# Patient Record
Sex: Female | Born: 1935 | Race: White | Hispanic: No | Smoking: Never smoker
Health system: Southern US, Community
[De-identification: ages and names within clinical notes are randomized; demographics above are authoritative.]

## PROBLEM LIST (undated history)

## (undated) DIAGNOSIS — F329 Major depressive disorder, single episode, unspecified: Secondary | ICD-10-CM

## (undated) DIAGNOSIS — T7840XA Allergy, unspecified, initial encounter: Secondary | ICD-10-CM

## (undated) DIAGNOSIS — F419 Anxiety disorder, unspecified: Secondary | ICD-10-CM

## (undated) DIAGNOSIS — I10 Essential (primary) hypertension: Secondary | ICD-10-CM

## (undated) DIAGNOSIS — K589 Irritable bowel syndrome without diarrhea: Secondary | ICD-10-CM

## (undated) DIAGNOSIS — Z7189 Other specified counseling: Secondary | ICD-10-CM

## (undated) DIAGNOSIS — F0390 Unspecified dementia without behavioral disturbance: Secondary | ICD-10-CM

## (undated) DIAGNOSIS — F32A Depression, unspecified: Secondary | ICD-10-CM

## (undated) DIAGNOSIS — M1990 Unspecified osteoarthritis, unspecified site: Secondary | ICD-10-CM

## (undated) DIAGNOSIS — R0989 Other specified symptoms and signs involving the circulatory and respiratory systems: Secondary | ICD-10-CM

## (undated) DIAGNOSIS — B009 Herpesviral infection, unspecified: Secondary | ICD-10-CM

## (undated) HISTORY — DX: Major depressive disorder, single episode, unspecified: F32.9

## (undated) HISTORY — DX: Depression, unspecified: F32.A

## (undated) HISTORY — DX: Anxiety disorder, unspecified: F41.9

## (undated) HISTORY — PX: ROTATOR CUFF REPAIR: SHX139

## (undated) HISTORY — PX: ABDOMINAL HYSTERECTOMY: SHX81

## (undated) HISTORY — DX: Herpesviral infection, unspecified: B00.9

## (undated) HISTORY — DX: Essential (primary) hypertension: I10

## (undated) HISTORY — DX: Other specified symptoms and signs involving the circulatory and respiratory systems: R09.89

## (undated) HISTORY — DX: Irritable bowel syndrome, unspecified: K58.9

## (undated) HISTORY — PX: EYE SURGERY: SHX253

## (undated) HISTORY — DX: Unspecified osteoarthritis, unspecified site: M19.90

## (undated) HISTORY — PX: JOINT REPLACEMENT: SHX530

## (undated) HISTORY — DX: Allergy, unspecified, initial encounter: T78.40XA

## (undated) HISTORY — DX: Other specified counseling: Z71.89

---

## 2000-08-16 ENCOUNTER — Encounter: Admission: RE | Admit: 2000-08-16 | Discharge: 2000-08-16 | Payer: Self-pay | Admitting: Internal Medicine

## 2000-08-16 ENCOUNTER — Encounter: Payer: Self-pay | Admitting: Internal Medicine

## 2001-09-12 ENCOUNTER — Other Ambulatory Visit: Admission: RE | Admit: 2001-09-12 | Discharge: 2001-09-12 | Payer: Self-pay | Admitting: Internal Medicine

## 2002-12-18 ENCOUNTER — Ambulatory Visit (HOSPITAL_COMMUNITY): Admission: RE | Admit: 2002-12-18 | Discharge: 2002-12-18 | Payer: Self-pay | Admitting: Internal Medicine

## 2003-09-03 ENCOUNTER — Encounter: Admission: RE | Admit: 2003-09-03 | Discharge: 2003-09-03 | Payer: Self-pay | Admitting: Internal Medicine

## 2004-04-05 ENCOUNTER — Emergency Department (HOSPITAL_COMMUNITY): Admission: EM | Admit: 2004-04-05 | Discharge: 2004-04-05 | Payer: Self-pay | Admitting: Emergency Medicine

## 2005-11-30 ENCOUNTER — Other Ambulatory Visit: Admission: RE | Admit: 2005-11-30 | Discharge: 2005-11-30 | Payer: Self-pay | Admitting: Internal Medicine

## 2008-07-23 ENCOUNTER — Ambulatory Visit: Payer: Self-pay | Admitting: Internal Medicine

## 2009-02-04 ENCOUNTER — Ambulatory Visit: Payer: Self-pay | Admitting: Internal Medicine

## 2009-08-05 ENCOUNTER — Ambulatory Visit: Payer: Self-pay | Admitting: Internal Medicine

## 2009-11-09 ENCOUNTER — Ambulatory Visit: Payer: Self-pay | Admitting: Internal Medicine

## 2009-11-23 ENCOUNTER — Ambulatory Visit: Payer: Self-pay | Admitting: Internal Medicine

## 2009-12-27 ENCOUNTER — Inpatient Hospital Stay (HOSPITAL_COMMUNITY): Admission: RE | Admit: 2009-12-27 | Discharge: 2009-12-31 | Payer: Self-pay | Admitting: Orthopedic Surgery

## 2010-04-28 ENCOUNTER — Ambulatory Visit: Payer: Self-pay | Admitting: Internal Medicine

## 2010-05-27 ENCOUNTER — Ambulatory Visit
Admission: RE | Admit: 2010-05-27 | Discharge: 2010-05-27 | Payer: Self-pay | Source: Home / Self Care | Attending: Internal Medicine | Admitting: Internal Medicine

## 2010-06-01 ENCOUNTER — Encounter
Admission: RE | Admit: 2010-06-01 | Discharge: 2010-06-01 | Payer: Self-pay | Source: Home / Self Care | Attending: Internal Medicine | Admitting: Internal Medicine

## 2010-07-29 LAB — COMPREHENSIVE METABOLIC PANEL
ALT: 14 U/L (ref 0–35)
Alkaline Phosphatase: 57 U/L (ref 39–117)
BUN: 17 mg/dL (ref 6–23)
CO2: 28 mEq/L (ref 19–32)
GFR calc non Af Amer: >60 mL/min (ref >60)
Glucose, Bld: 92 mg/dL (ref 70–99)
Potassium: 4.8 mEq/L (ref 3.5–5.1)
Sodium: 139 mEq/L (ref 135–145)
Total Bilirubin: 0.6 mg/dL (ref 0.3–1.2)
Total Protein: 6.9 g/dL (ref 6.0–8.3)

## 2010-07-29 LAB — URINALYSIS, ROUTINE W REFLEX MICROSCOPIC
Glucose, UA: NEGATIVE mg/dL
Hgb urine dipstick: NEGATIVE
Ketones, ur: NEGATIVE mg/dL
Protein, ur: NEGATIVE mg/dL
pH: 6 (ref 5.0–8.0)

## 2010-07-29 LAB — CBC
HCT: 28.1 % — ABNORMAL LOW (ref 36.0–46.0)
HCT: 29.2 % — ABNORMAL LOW (ref 36.0–46.0)
HCT: 35.8 % — ABNORMAL LOW (ref 36.0–46.0)
Hemoglobin: 12 g/dL (ref 12.0–15.0)
MCH: 30.5 pg (ref 26.0–34.0)
MCH: 30.5 pg (ref 26.0–34.0)
MCH: 30.5 pg (ref 26.0–34.0)
MCHC: 33.4 g/dL (ref 30.0–36.0)
MCHC: 33.5 g/dL (ref 30.0–36.0)
MCHC: 33.8 g/dL (ref 30.0–36.0)
MCHC: 33.6 g/dL (ref 30.0–36.0)
MCV: 90 fL (ref 78.0–100.0)
MCV: 90.3 fL (ref 78.0–100.0)
MCV: 90.9 fL (ref 78.0–100.0)
MCV: 91.6 fL (ref 78.0–100.0)
Platelets: 178 10*3/uL (ref 150–400)
Platelets: 195 10*3/uL (ref 150–400)
Platelets: 248 10*3/uL (ref 150–400)
RDW: 12.5 % (ref 11.5–15.5)
RDW: 12.5 % (ref 11.5–15.5)
RDW: 12.7 % (ref 11.5–15.5)
RDW: 12.8 % (ref 11.5–15.5)
RDW: 12.5 % (ref 11.5–15.5)
WBC: 11 10*3/uL — ABNORMAL HIGH (ref 4.0–10.5)
WBC: 9.1 10*3/uL (ref 4.0–10.5)

## 2010-07-29 LAB — PROTIME-INR
INR: 1.12 (ref 0.00–1.49)
INR: 1.01 (ref 0.00–1.49)
Prothrombin Time: 13.5 seconds (ref 11.6–15.2)

## 2010-07-29 LAB — SURGICAL PCR SCREEN: MRSA, PCR: NEGATIVE

## 2010-07-29 LAB — BASIC METABOLIC PANEL
BUN: 10 mg/dL (ref 6–23)
BUN: 7 mg/dL (ref 6–23)
CO2: 24 mEq/L (ref 19–32)
Calcium: 7.9 mg/dL — ABNORMAL LOW (ref 8.4–10.5)
Chloride: 110 mEq/L (ref 96–112)
Creatinine, Ser: 0.72 mg/dL (ref 0.4–1.2)
GFR calc non Af Amer: >60 mL/min (ref >60)
Glucose, Bld: 141 mg/dL — ABNORMAL HIGH (ref 70–99)
Glucose, Bld: 147 mg/dL — ABNORMAL HIGH (ref 70–99)
Potassium: 3.7 mEq/L (ref 3.5–5.1)

## 2010-07-29 LAB — TYPE AND SCREEN
ABO/RH(D): O POS
Antibody Screen: NEGATIVE

## 2010-08-20 ENCOUNTER — Emergency Department (HOSPITAL_COMMUNITY): Payer: Medicare Other

## 2010-08-20 ENCOUNTER — Emergency Department (HOSPITAL_COMMUNITY)
Admission: EM | Admit: 2010-08-20 | Discharge: 2010-08-20 | Disposition: A | Discharge status: Home / Self Care | Payer: Medicare Other | Attending: Emergency Medicine | Admitting: Emergency Medicine

## 2010-08-20 DIAGNOSIS — S62109A Fracture of unspecified carpal bone, unspecified wrist, initial encounter for closed fracture: Secondary | ICD-10-CM | POA: Insufficient documentation

## 2010-08-20 DIAGNOSIS — M542 Cervicalgia: Secondary | ICD-10-CM | POA: Insufficient documentation

## 2010-08-20 DIAGNOSIS — R51 Headache: Secondary | ICD-10-CM | POA: Insufficient documentation

## 2010-08-20 DIAGNOSIS — D649 Anemia, unspecified: Secondary | ICD-10-CM | POA: Insufficient documentation

## 2010-08-20 DIAGNOSIS — S0180XA Unspecified open wound of other part of head, initial encounter: Secondary | ICD-10-CM | POA: Insufficient documentation

## 2010-08-20 DIAGNOSIS — I10 Essential (primary) hypertension: Secondary | ICD-10-CM | POA: Insufficient documentation

## 2010-08-20 DIAGNOSIS — Y92009 Unspecified place in unspecified non-institutional (private) residence as the place of occurrence of the external cause: Secondary | ICD-10-CM | POA: Insufficient documentation

## 2010-08-20 DIAGNOSIS — S02109A Fracture of base of skull, unspecified side, initial encounter for closed fracture: Secondary | ICD-10-CM | POA: Insufficient documentation

## 2010-08-20 DIAGNOSIS — W101XXA Fall (on)(from) sidewalk curb, initial encounter: Secondary | ICD-10-CM | POA: Insufficient documentation

## 2010-11-29 ENCOUNTER — Other Ambulatory Visit: Payer: Self-pay | Admitting: Internal Medicine

## 2010-11-30 ENCOUNTER — Other Ambulatory Visit: Payer: Self-pay

## 2010-11-30 MED ORDER — TEMAZEPAM 15 MG PO CAPS: 15.0000 mg | ORAL_CAPSULE | Freq: Every evening | ORAL | Status: DC | PRN

## 2010-11-30 MED ORDER — DONEPEZIL HCL 5 MG PO TABS: 5.0000 mg | ORAL_TABLET | Freq: Every day | ORAL | Status: DC

## 2010-12-29 ENCOUNTER — Encounter: Payer: Self-pay | Admitting: Internal Medicine

## 2010-12-29 ENCOUNTER — Ambulatory Visit (INDEPENDENT_AMBULATORY_CARE_PROVIDER_SITE_OTHER): Payer: Medicare Other | Admitting: Internal Medicine

## 2010-12-29 VITALS — BP 130/78 | HR 78 | Temp 99.0°F | Ht <= 58 in | Wt 118.5 lb

## 2010-12-29 DIAGNOSIS — R05 Cough: Secondary | ICD-10-CM

## 2010-12-29 DIAGNOSIS — J40 Bronchitis, not specified as acute or chronic: Secondary | ICD-10-CM

## 2010-12-29 MED ORDER — CEFTRIAXONE SODIUM 1 G IJ SOLR
1.0000 g | Freq: Once | INTRAMUSCULAR | Status: AC
  Administered 2010-12-29: 1 g via INTRAMUSCULAR

## 2010-12-30 ENCOUNTER — Ambulatory Visit (INDEPENDENT_AMBULATORY_CARE_PROVIDER_SITE_OTHER): Payer: Medicare Other | Admitting: Internal Medicine

## 2010-12-30 ENCOUNTER — Encounter: Payer: Self-pay | Admitting: Internal Medicine

## 2010-12-30 ENCOUNTER — Ambulatory Visit
Admission: RE | Admit: 2010-12-30 | Discharge: 2010-12-30 | Disposition: A | Discharge status: Home / Self Care | Payer: BLUE CROSS/BLUE SHIELD | Source: Ambulatory Visit | Attending: Internal Medicine | Admitting: Internal Medicine

## 2010-12-30 VITALS — Temp 99.2°F

## 2010-12-30 DIAGNOSIS — J189 Pneumonia, unspecified organism: Secondary | ICD-10-CM

## 2010-12-30 DIAGNOSIS — I10 Essential (primary) hypertension: Secondary | ICD-10-CM

## 2010-12-30 DIAGNOSIS — R05 Cough: Secondary | ICD-10-CM

## 2010-12-30 DIAGNOSIS — F0390 Unspecified dementia without behavioral disturbance: Secondary | ICD-10-CM | POA: Insufficient documentation

## 2010-12-30 MED ORDER — CEFTRIAXONE SODIUM 1 G IJ SOLR
1.0000 g | INTRAMUSCULAR | Status: AC
  Administered 2010-12-30: 1 g via INTRAMUSCULAR

## 2010-12-30 NOTE — Progress Notes (Signed)
  Subjective:    Patient ID: Laura Lowe, female    DOB: 1919/09/20, 77 y.o.   MRN: [id number]  HPI    Review of Systems     Objective:   Physical Exam        Assessment & Plan:

## 2010-12-30 NOTE — Progress Notes (Signed)
  Subjective:    Patient ID: Laura Lowe, female    DOB: November 01, 1919, 77 y.o.   MRN: [id number]  HPI 77 year old white female with history of dementia went to the beach with her family and call respiratory infection from a relative. Has been coughing quite a bit. Only thick white sputum production. No documented fever or shaking chills. Daughter who is a Engineer, civil (consulting) is been concerned.    Review of Systems     Objective:   Physical Exam chest: Wheezing and rales right upper and right lower lobe.        Assessment & Plan:  Chest x-ray shows right lower lobe pneumonia. Given 1 g IM Rocephin. She got 1 g IM Rocephin yesterday. She is on a Zithromax Z-Pak. Recheck in 10 days with chest x-ray and office visit

## 2010-12-30 NOTE — Progress Notes (Signed)
  Subjective:    Patient ID: Laura Lowe, female    DOB: 01/18/1920, 77 y.o.   MRN: [id number]  HPI in an 77 year old white female with history of dementia and hypertension. She went to the beach with some relatives and came down with a respiratory infection. Has had a lot of coughing. Says sputum is white and thick. Denies discolored sputum, fever, or shaking chills. Cough sounds loose and congested.    Review of Systems     Objective:   Physical Exam HEENT exam: TMs and pharynx are clear; neck is supple; chest: rales and wheezing right upper lobe         Assessment & Plan:  Bronchitis- possible pneumonia  Plan: Rocephin 1 g IM given in the office today  Zithromax Z-Pak take as directed  Tessalon Perles 100 mg (#60) 2 by mouth 3 times a day when necessary for cough  Chest x-ray tomorrow (PA and lateral)

## 2011-01-01 MED ORDER — CEFTRIAXONE SODIUM 1 G IJ SOLR: 1.0000 g | Freq: Once | INTRAMUSCULAR | Status: DC

## 2011-01-10 ENCOUNTER — Ambulatory Visit
Admission: RE | Admit: 2011-01-10 | Discharge: 2011-01-10 | Disposition: A | Discharge status: Home / Self Care | Payer: Medicare Other | Source: Ambulatory Visit | Attending: Internal Medicine | Admitting: Internal Medicine

## 2011-01-10 ENCOUNTER — Other Ambulatory Visit: Payer: Self-pay | Admitting: Internal Medicine

## 2011-01-10 DIAGNOSIS — J189 Pneumonia, unspecified organism: Secondary | ICD-10-CM

## 2011-02-02 ENCOUNTER — Other Ambulatory Visit: Payer: Medicare Other | Admitting: Internal Medicine

## 2011-02-03 ENCOUNTER — Encounter: Payer: Medicare Other | Admitting: Internal Medicine

## 2011-03-13 ENCOUNTER — Other Ambulatory Visit: Payer: Medicare Other | Admitting: Internal Medicine

## 2011-03-13 DIAGNOSIS — I10 Essential (primary) hypertension: Secondary | ICD-10-CM

## 2011-03-13 DIAGNOSIS — E785 Hyperlipidemia, unspecified: Secondary | ICD-10-CM

## 2011-03-13 DIAGNOSIS — D649 Anemia, unspecified: Secondary | ICD-10-CM

## 2011-03-13 DIAGNOSIS — E559 Vitamin D deficiency, unspecified: Secondary | ICD-10-CM

## 2011-03-13 LAB — CBC WITH DIFFERENTIAL/PLATELET
Basophils Absolute: 0 10*3/uL (ref 0.0–0.1)
Eosinophils Absolute: 0.2 10*3/uL (ref 0.0–0.7)
Eosinophils Relative: 3 % (ref 0–5)
Lymphocytes Relative: 30 % (ref 12–46)
MCH: 29.2 pg (ref 26.0–34.0)
MCV: 93.7 fL (ref 78.0–100.0)
Platelets: 269 10*3/uL (ref 150–400)
RDW: 13.6 % (ref 11.5–15.5)
WBC: 7.1 10*3/uL (ref 4.0–10.5)

## 2011-03-13 LAB — COMPREHENSIVE METABOLIC PANEL
ALT: 13 U/L (ref 0–35)
AST: 20 U/L (ref 0–37)
Alkaline Phosphatase: 60 U/L (ref 39–117)
Creat: 0.8 mg/dL (ref 0.50–1.10)
Total Bilirubin: 0.5 mg/dL (ref 0.3–1.2)

## 2011-03-13 LAB — LIPID PANEL
Total CHOL/HDL Ratio: 3.3 Ratio
VLDL: 19 mg/dL (ref 0–40)

## 2011-03-13 LAB — TSH: TSH: 6.206 u[IU]/mL — ABNORMAL HIGH (ref 0.350–4.500)

## 2011-03-14 ENCOUNTER — Other Ambulatory Visit: Payer: Medicare Other | Admitting: Internal Medicine

## 2011-03-14 ENCOUNTER — Ambulatory Visit (INDEPENDENT_AMBULATORY_CARE_PROVIDER_SITE_OTHER): Payer: Medicare Other | Admitting: Internal Medicine

## 2011-03-14 ENCOUNTER — Encounter: Payer: Medicare Other | Admitting: Internal Medicine

## 2011-03-14 ENCOUNTER — Encounter: Payer: Self-pay | Admitting: Internal Medicine

## 2011-03-14 VITALS — BP 120/76 | HR 72 | Temp 98.6°F | Ht <= 58 in | Wt 124.0 lb

## 2011-03-14 DIAGNOSIS — G4700 Insomnia, unspecified: Secondary | ICD-10-CM

## 2011-03-14 DIAGNOSIS — R0989 Other specified symptoms and signs involving the circulatory and respiratory systems: Secondary | ICD-10-CM

## 2011-03-14 DIAGNOSIS — Z23 Encounter for immunization: Secondary | ICD-10-CM

## 2011-03-14 DIAGNOSIS — M1990 Unspecified osteoarthritis, unspecified site: Secondary | ICD-10-CM

## 2011-03-14 DIAGNOSIS — Z0000 Encounter for general adult medical examination without abnormal findings: Secondary | ICD-10-CM

## 2011-03-14 DIAGNOSIS — F419 Anxiety disorder, unspecified: Secondary | ICD-10-CM

## 2011-03-14 LAB — VITAMIN D 25 HYDROXY (VIT D DEFICIENCY, FRACTURES): Vit D, 25-Hydroxy: 43 ng/mL (ref 30–89)

## 2011-03-14 LAB — POCT URINALYSIS DIPSTICK
Bilirubin, UA: NEGATIVE
Blood, UA: NEGATIVE
Nitrite, UA: NEGATIVE
Urobilinogen, UA: NEGATIVE
pH, UA: 5

## 2011-03-19 DIAGNOSIS — M810 Age-related osteoporosis without current pathological fracture: Secondary | ICD-10-CM | POA: Insufficient documentation

## 2011-03-19 DIAGNOSIS — G4700 Insomnia, unspecified: Secondary | ICD-10-CM | POA: Insufficient documentation

## 2011-03-19 DIAGNOSIS — H409 Unspecified glaucoma: Secondary | ICD-10-CM | POA: Insufficient documentation

## 2011-03-19 DIAGNOSIS — J309 Allergic rhinitis, unspecified: Secondary | ICD-10-CM | POA: Insufficient documentation

## 2011-03-19 DIAGNOSIS — E559 Vitamin D deficiency, unspecified: Secondary | ICD-10-CM | POA: Insufficient documentation

## 2011-03-19 DIAGNOSIS — M1990 Unspecified osteoarthritis, unspecified site: Secondary | ICD-10-CM | POA: Insufficient documentation

## 2011-03-19 DIAGNOSIS — H9190 Unspecified hearing loss, unspecified ear: Secondary | ICD-10-CM | POA: Insufficient documentation

## 2011-03-19 DIAGNOSIS — F419 Anxiety disorder, unspecified: Secondary | ICD-10-CM | POA: Insufficient documentation

## 2011-03-19 DIAGNOSIS — R0989 Other specified symptoms and signs involving the circulatory and respiratory systems: Secondary | ICD-10-CM | POA: Insufficient documentation

## 2011-03-19 NOTE — Patient Instructions (Signed)
Please continue with same medications. Return in 6 months for followup.
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
## 2011-04-03 ENCOUNTER — Other Ambulatory Visit: Payer: Self-pay | Admitting: Internal Medicine

## 2011-04-04 ENCOUNTER — Other Ambulatory Visit: Payer: Self-pay

## 2011-04-04 MED ORDER — DIAZEPAM 5 MG PO TABS: 5.0000 mg | ORAL_TABLET | Freq: Four times a day (QID) | ORAL | Status: DC | PRN

## 2011-05-12 ENCOUNTER — Other Ambulatory Visit: Payer: Medicare Other | Admitting: Internal Medicine

## 2011-06-05 ENCOUNTER — Other Ambulatory Visit: Payer: Self-pay | Admitting: Internal Medicine

## 2011-07-03 ENCOUNTER — Other Ambulatory Visit: Payer: Self-pay | Admitting: Internal Medicine

## 2011-07-18 ENCOUNTER — Other Ambulatory Visit: Payer: Self-pay | Admitting: Internal Medicine

## 2011-07-18 NOTE — Telephone Encounter (Signed)
Please refill once 

## 2011-08-02 DIAGNOSIS — H409 Unspecified glaucoma: Secondary | ICD-10-CM | POA: Diagnosis not present

## 2011-08-02 DIAGNOSIS — H4010X0 Unspecified open-angle glaucoma, stage unspecified: Secondary | ICD-10-CM | POA: Diagnosis not present

## 2011-08-04 DIAGNOSIS — Z1231 Encounter for screening mammogram for malignant neoplasm of breast: Secondary | ICD-10-CM | POA: Diagnosis not present

## 2011-08-08 ENCOUNTER — Encounter: Payer: Self-pay | Admitting: Internal Medicine

## 2011-08-14 ENCOUNTER — Other Ambulatory Visit: Payer: Self-pay | Admitting: Internal Medicine

## 2011-08-28 ENCOUNTER — Ambulatory Visit (INDEPENDENT_AMBULATORY_CARE_PROVIDER_SITE_OTHER): Payer: Medicare Other | Admitting: Internal Medicine

## 2011-08-28 ENCOUNTER — Encounter: Payer: Self-pay | Admitting: Internal Medicine

## 2011-08-28 VITALS — BP 132/64 | HR 68 | Resp 14

## 2011-08-28 DIAGNOSIS — F0390 Unspecified dementia without behavioral disturbance: Secondary | ICD-10-CM

## 2011-08-28 DIAGNOSIS — F411 Generalized anxiety disorder: Secondary | ICD-10-CM

## 2011-08-28 DIAGNOSIS — I10 Essential (primary) hypertension: Secondary | ICD-10-CM

## 2011-08-28 DIAGNOSIS — M1990 Unspecified osteoarthritis, unspecified site: Secondary | ICD-10-CM | POA: Diagnosis not present

## 2011-08-28 DIAGNOSIS — F419 Anxiety disorder, unspecified: Secondary | ICD-10-CM

## 2011-08-28 NOTE — Patient Instructions (Signed)
Continue same medications and return in 6 months 

## 2011-09-01 DIAGNOSIS — H4011X0 Primary open-angle glaucoma, stage unspecified: Secondary | ICD-10-CM | POA: Diagnosis not present

## 2011-09-10 NOTE — Progress Notes (Signed)
  Subjective:    Patient ID: Laura Lowe, female    DOB: [birth year], 78 y.o.   MRN: [id number]  HPI an 78 year old white female in today with her daughter regarding form needing completion for DMV. Apparently a few months ago she was stopped by Emergency planning/management officer around Christmas time. One of her daughters was dying of a brain tumor. It was around dark and she was a bit lost. Her daughter says that she really drives just during the day and not at night and she doesn't drive very far from home. She feels that she does okay with that amount of driving. Says patient has been distraught over the loss of her daughter. She has grieved appropriately. Patient is alert oriented today. She knows me.    Review of Systems     Objective:   Physical Exam patient knows day of week, year, month. Knows who the president is. No gross focal deficits on brief neurological exam. She may have some mild dementia but basically seems to be okay.        Assessment & Plan:  Hypertension  Mild dementia  Anxiety  Insomnia  Plan: Form completed for DMV to allow her to drive short distances in town and no Interstate driving. No night driving.

## 2011-09-12 ENCOUNTER — Other Ambulatory Visit: Payer: Medicare Other | Admitting: Internal Medicine

## 2011-09-12 DIAGNOSIS — E039 Hypothyroidism, unspecified: Secondary | ICD-10-CM

## 2011-09-14 ENCOUNTER — Encounter: Payer: Self-pay | Admitting: Internal Medicine

## 2011-09-14 ENCOUNTER — Ambulatory Visit (INDEPENDENT_AMBULATORY_CARE_PROVIDER_SITE_OTHER): Payer: Medicare Other | Admitting: Internal Medicine

## 2011-09-14 VITALS — BP 160/82 | HR 72 | Temp 98.7°F | Wt 129.5 lb

## 2011-09-14 DIAGNOSIS — F4321 Adjustment disorder with depressed mood: Secondary | ICD-10-CM

## 2011-09-14 DIAGNOSIS — E039 Hypothyroidism, unspecified: Secondary | ICD-10-CM

## 2011-09-14 DIAGNOSIS — R011 Cardiac murmur, unspecified: Secondary | ICD-10-CM

## 2011-09-14 DIAGNOSIS — I10 Essential (primary) hypertension: Secondary | ICD-10-CM

## 2011-09-14 DIAGNOSIS — G4700 Insomnia, unspecified: Secondary | ICD-10-CM | POA: Diagnosis not present

## 2011-09-14 DIAGNOSIS — F0390 Unspecified dementia without behavioral disturbance: Secondary | ICD-10-CM

## 2011-09-14 MED ORDER — LEVOTHYROXINE SODIUM 50 MCG PO TABS: 50.0000 ug | ORAL_TABLET | Freq: Every day | ORAL | Status: DC

## 2011-09-14 NOTE — Progress Notes (Signed)
  Subjective:    Patient ID: Edman Circle, female    DOB: [birth year], 78 y.o.   MRN: [id number]  HPI For 6 month follow up. Not sleeping well. Daughter died of cancer earlier this year. Appropriate grief reaction. I believe she has some mild dementia. Continues to drive short distances around her home but is brought to office today by son-in-law. Denies noncompliance with medication. Doesn't think she has any Restoril left. We called drugstore and she does have refill on Restoril. She's not been taking it. This could help her sleep. We started her on thyroid replacement therapy fall 2012. She got 90 tablets but apparently did not have it refilled. This would've been around the time of her daughter passed away. My guess is she forgot about having it refilled with the events surrounding the death of her daughter from cancer.    Review of Systems     Objective:   Physical Exam chest clear to auscultation, cardiac exam regular rate and rhythm 1/6 systolic ejection murmur. Loud S1.        Assessment & Plan:  Hypertension  Anxiety  Grief reaction  Insomnia  Dementia  Plan: Patient should have Restoril refill. Pharmacy indicate she does have valid prescription on file. Restart Synthroid 0.05 mg. Is return in 6-8 weeks for office visit and TSH.

## 2011-09-15 NOTE — Patient Instructions (Signed)
Take Restoril as needed for insomnia. Restart Synthroid 0.05 mg daily for hypothyroidism. Continue antihypertensive medication. Return in 6 weeks for office visit and TSH.

## 2011-10-08 ENCOUNTER — Other Ambulatory Visit: Payer: Self-pay | Admitting: Internal Medicine

## 2011-10-25 ENCOUNTER — Other Ambulatory Visit: Payer: Self-pay | Admitting: Internal Medicine

## 2011-10-27 ENCOUNTER — Ambulatory Visit (INDEPENDENT_AMBULATORY_CARE_PROVIDER_SITE_OTHER): Payer: Medicare Other | Admitting: Internal Medicine

## 2011-10-27 ENCOUNTER — Encounter: Payer: Self-pay | Admitting: Internal Medicine

## 2011-10-27 VITALS — BP 150/74 | HR 58 | Temp 98.2°F | Ht <= 58 in | Wt 124.0 lb

## 2011-10-27 DIAGNOSIS — I10 Essential (primary) hypertension: Secondary | ICD-10-CM | POA: Diagnosis not present

## 2011-10-27 DIAGNOSIS — F0390 Unspecified dementia without behavioral disturbance: Secondary | ICD-10-CM | POA: Diagnosis not present

## 2011-10-27 DIAGNOSIS — F411 Generalized anxiety disorder: Secondary | ICD-10-CM

## 2011-10-27 DIAGNOSIS — F419 Anxiety disorder, unspecified: Secondary | ICD-10-CM

## 2011-10-27 DIAGNOSIS — E039 Hypothyroidism, unspecified: Secondary | ICD-10-CM

## 2011-10-27 MED ORDER — RAMIPRIL 10 MG PO CAPS: 10.0000 mg | ORAL_CAPSULE | Freq: Every day | ORAL | Status: DC

## 2011-10-29 NOTE — Patient Instructions (Addendum)
Continue same medications and return in 6 months for physical examination. Be careful driving.

## 2011-10-29 NOTE — Progress Notes (Signed)
  Subjective:    Patient ID: Laura Lowe, female    DOB: 12/30/1919, 78 y.o.   MRN: [id number]  HPI 78 year old white female with history of hypertension, hypothyroidism osteoporosis, dementia, hearing loss for recheck. He was seen recently to have driver's license form completed in April. This was approved without problem according to her daughter Ulyess Blossom who is a Engineer, civil (consulting). Lupita Leash is very attentive of her mother and sees her everyday. She's been checking patient's blood pressure at home and it's very acceptable. Patient gets anxious when she comes to doctor's office. With regard to dementia, Lupita Leash reports that her symptoms are stable and she continues to do well living independently at home with frequent checks by relatives. Doesn't drive very much at all but likes a ID of having her driver's license. Compliant with her medications. Lupita Leash make sure of that. Main reason for visit today is followup on hypothyroidism which was diagnosed recently.    Review of Systems     Objective:   Physical Exam neck is supple without thyromegaly , has left carotid bruit; chest clear to auscultation; cardiac exam regular rate and rhythm normal S1 and S2; extremities without edema. She is alert and oriented x3. Speech is appropriate.        Assessment & Plan:  Hypertension-continue same medication  Hypothyroidism-TSH drawn  Dementia-stable on Aricept  Hearing loss  Anxiety  Plan: Daughter requests that we give refills on Valium 5 mg. Written prescription for #60 with 3 refills 1 by mouth twice daily as needed for anxiety or agitation. Continue Aricept for dementia. Return in 6 months for physical examination. Spent 25 minutes with patient and her daughter reviewing her home safety, in activities of daily living, medication needs, etc.

## 2012-01-01 DIAGNOSIS — H409 Unspecified glaucoma: Secondary | ICD-10-CM | POA: Diagnosis not present

## 2012-01-01 DIAGNOSIS — Z961 Presence of intraocular lens: Secondary | ICD-10-CM | POA: Diagnosis not present

## 2012-01-01 DIAGNOSIS — H4011X0 Primary open-angle glaucoma, stage unspecified: Secondary | ICD-10-CM | POA: Diagnosis not present

## 2012-01-11 ENCOUNTER — Other Ambulatory Visit: Payer: Self-pay

## 2012-01-11 MED ORDER — TEMAZEPAM 15 MG PO CAPS: 30.0000 mg | ORAL_CAPSULE | Freq: Every evening | ORAL | Status: DC | PRN

## 2012-02-06 ENCOUNTER — Other Ambulatory Visit: Payer: Self-pay | Admitting: Internal Medicine

## 2012-04-25 ENCOUNTER — Other Ambulatory Visit: Payer: Medicare Other | Admitting: Internal Medicine

## 2012-04-25 DIAGNOSIS — E039 Hypothyroidism, unspecified: Secondary | ICD-10-CM | POA: Diagnosis not present

## 2012-04-25 DIAGNOSIS — I10 Essential (primary) hypertension: Secondary | ICD-10-CM

## 2012-04-25 DIAGNOSIS — E785 Hyperlipidemia, unspecified: Secondary | ICD-10-CM

## 2012-04-25 DIAGNOSIS — Z79899 Other long term (current) drug therapy: Secondary | ICD-10-CM

## 2012-04-25 DIAGNOSIS — D509 Iron deficiency anemia, unspecified: Secondary | ICD-10-CM | POA: Diagnosis not present

## 2012-04-25 LAB — COMPREHENSIVE METABOLIC PANEL
Alkaline Phosphatase: 62 U/L (ref 39–117)
BUN: 23 mg/dL (ref 6–23)
Creat: 0.81 mg/dL (ref 0.50–1.10)
Glucose, Bld: 102 mg/dL — ABNORMAL HIGH (ref 70–99)
Sodium: 145 mEq/L (ref 135–145)
Total Bilirubin: 0.3 mg/dL (ref 0.3–1.2)

## 2012-04-25 LAB — CBC WITH DIFFERENTIAL/PLATELET
Basophils Relative: 0 % (ref 0–1)
Eosinophils Absolute: 0.2 10*3/uL (ref 0.0–0.7)
Eosinophils Relative: 3 % (ref 0–5)
Hemoglobin: 10.9 g/dL — ABNORMAL LOW (ref 12.0–15.0)
MCH: 26.7 pg (ref 26.0–34.0)
MCHC: 32.3 g/dL (ref 30.0–36.0)
MCV: 82.6 fL (ref 78.0–100.0)
Monocytes Absolute: 0.6 10*3/uL (ref 0.1–1.0)
Monocytes Relative: 10 % (ref 3–12)
Neutrophils Relative %: 58 % (ref 43–77)

## 2012-04-25 LAB — LIPID PANEL
Cholesterol: 160 mg/dL (ref 0–200)
HDL: 49 mg/dL (ref >39)
LDL Cholesterol: 90 mg/dL (ref 0–99)
Total CHOL/HDL Ratio: 3.3 Ratio
Triglycerides: 105 mg/dL (ref <150)
VLDL: 21 mg/dL (ref 0–40)

## 2012-04-25 LAB — TSH: TSH: 2.944 u[IU]/mL (ref 0.350–4.500)

## 2012-04-26 ENCOUNTER — Ambulatory Visit (INDEPENDENT_AMBULATORY_CARE_PROVIDER_SITE_OTHER): Payer: Medicare Other | Admitting: Internal Medicine

## 2012-04-26 VITALS — BP 140/68 | HR 76 | Resp 18 | Wt 126.0 lb

## 2012-04-26 DIAGNOSIS — D509 Iron deficiency anemia, unspecified: Secondary | ICD-10-CM

## 2012-04-26 DIAGNOSIS — E039 Hypothyroidism, unspecified: Secondary | ICD-10-CM

## 2012-04-26 DIAGNOSIS — F0280 Dementia in other diseases classified elsewhere without behavioral disturbance: Secondary | ICD-10-CM | POA: Diagnosis not present

## 2012-04-26 DIAGNOSIS — G309 Alzheimer's disease, unspecified: Secondary | ICD-10-CM | POA: Diagnosis not present

## 2012-04-26 DIAGNOSIS — J309 Allergic rhinitis, unspecified: Secondary | ICD-10-CM

## 2012-04-26 DIAGNOSIS — I10 Essential (primary) hypertension: Secondary | ICD-10-CM | POA: Diagnosis not present

## 2012-04-26 DIAGNOSIS — F411 Generalized anxiety disorder: Secondary | ICD-10-CM

## 2012-04-26 NOTE — Progress Notes (Signed)
  Subjective:    Patient ID: Laura Lowe, female    DOB: October 29, 1919, 78 y.o.   MRN: [id number]  HPI For 6 month recheck. Has dementia. Daughter who is a nurse wants to change her from Aricept to [place] because of appetite concerns but she eats groceries purchased and has not lost weight. Was on Fe supplement for anemia and daughter discontinued this because of constipation issues. Hgb is decreased and Fe needs to be restarted with stool softener. Has come down with acute URI since Thanksgiving. Tearful in office today remembering daughter who passed away with cancer. BP is stable at home per daughter. History of hypothyroidism and anxiety. TSH is OK.    Review of Systems     Objective:   Physical Exam Skin pale warm and dry; repeats herself often; HEENT: TMs and pharynx are clear; neck is supple without thyromegaly or adenopathy. Chest is clear Cor: RRR Ext: no edema; neuro: Patient is alert. Repeats herself frequently. Cooperative and pleasant.        Assessment & Plan:  URI-treat with Zithromax Z-PAK. HTN-stable Dementia-stable Anemia-iron deficiency. Restart iron supplement. Hypothyroidism-TSH is within normal limits. Plan: D/C Aricept and start Namenda for dementia.

## 2012-05-11 ENCOUNTER — Other Ambulatory Visit: Payer: Self-pay | Admitting: Internal Medicine

## 2012-05-12 NOTE — Telephone Encounter (Signed)
Please refill for 30 days with 2 refills.

## 2012-05-13 ENCOUNTER — Other Ambulatory Visit: Payer: Self-pay

## 2012-06-05 ENCOUNTER — Other Ambulatory Visit: Payer: Self-pay

## 2012-06-05 MED ORDER — LEVOTHYROXINE SODIUM 50 MCG PO TABS: 50.0000 ug | ORAL_TABLET | Freq: Every day | ORAL | Status: DC

## 2012-06-10 DIAGNOSIS — H409 Unspecified glaucoma: Secondary | ICD-10-CM | POA: Diagnosis not present

## 2012-06-10 DIAGNOSIS — H16109 Unspecified superficial keratitis, unspecified eye: Secondary | ICD-10-CM | POA: Diagnosis not present

## 2012-06-10 DIAGNOSIS — H04129 Dry eye syndrome of unspecified lacrimal gland: Secondary | ICD-10-CM | POA: Diagnosis not present

## 2012-06-10 DIAGNOSIS — H4011X0 Primary open-angle glaucoma, stage unspecified: Secondary | ICD-10-CM | POA: Diagnosis not present

## 2012-07-27 ENCOUNTER — Encounter: Payer: Self-pay | Admitting: Internal Medicine

## 2012-07-27 NOTE — Patient Instructions (Addendum)
Restart iron supplement with stool softener. Discontinue Aricept and start Namenda return in 6 months

## 2012-07-29 ENCOUNTER — Other Ambulatory Visit: Payer: Self-pay | Admitting: Internal Medicine

## 2012-08-12 DIAGNOSIS — H409 Unspecified glaucoma: Secondary | ICD-10-CM | POA: Diagnosis not present

## 2012-08-12 DIAGNOSIS — H4011X0 Primary open-angle glaucoma, stage unspecified: Secondary | ICD-10-CM | POA: Diagnosis not present

## 2012-08-12 DIAGNOSIS — Z961 Presence of intraocular lens: Secondary | ICD-10-CM | POA: Diagnosis not present

## 2012-08-14 ENCOUNTER — Other Ambulatory Visit: Payer: Self-pay

## 2012-08-14 MED ORDER — MEMANTINE HCL 10 MG PO TABS: 10.0000 mg | ORAL_TABLET | Freq: Two times a day (BID) | ORAL | Status: DC

## 2012-09-02 ENCOUNTER — Other Ambulatory Visit: Payer: Self-pay

## 2012-09-02 MED ORDER — RAMIPRIL 10 MG PO CAPS: 10.0000 mg | ORAL_CAPSULE | Freq: Every day | ORAL | Status: DC

## 2012-09-12 ENCOUNTER — Other Ambulatory Visit: Payer: Self-pay

## 2012-09-12 ENCOUNTER — Other Ambulatory Visit: Payer: Self-pay | Admitting: Internal Medicine

## 2012-09-12 MED ORDER — DIAZEPAM 5 MG PO TABS: 5.0000 mg | ORAL_TABLET | Freq: Two times a day (BID) | ORAL | Status: DC | PRN

## 2012-11-08 ENCOUNTER — Ambulatory Visit (INDEPENDENT_AMBULATORY_CARE_PROVIDER_SITE_OTHER): Payer: Medicare Other | Admitting: Internal Medicine

## 2012-11-08 ENCOUNTER — Encounter: Payer: Self-pay | Admitting: Internal Medicine

## 2012-11-08 VITALS — BP 150/68 | HR 76 | Temp 98.8°F | Wt 130.5 lb

## 2012-11-08 DIAGNOSIS — E039 Hypothyroidism, unspecified: Secondary | ICD-10-CM | POA: Diagnosis not present

## 2012-11-08 DIAGNOSIS — D649 Anemia, unspecified: Secondary | ICD-10-CM

## 2012-11-08 DIAGNOSIS — I10 Essential (primary) hypertension: Secondary | ICD-10-CM | POA: Diagnosis not present

## 2012-11-08 LAB — CBC WITH DIFFERENTIAL/PLATELET
Basophils Absolute: 0 10*3/uL (ref 0.0–0.1)
Basophils Relative: 1 % (ref 0–1)
HCT: 37.4 % (ref 36.0–46.0)
Hemoglobin: 12 g/dL (ref 12.0–15.0)
Lymphocytes Relative: 27 % (ref 12–46)
Monocytes Absolute: 0.6 10*3/uL (ref 0.1–1.0)
Neutro Abs: 4.8 10*3/uL (ref 1.7–7.7)
Neutrophils Relative %: 62 % (ref 43–77)
RDW: 14.4 % (ref 11.5–15.5)
WBC: 7.7 10*3/uL (ref 4.0–10.5)

## 2012-11-08 LAB — COMPREHENSIVE METABOLIC PANEL
ALT: 11 U/L (ref 0–35)
AST: 19 U/L (ref 0–37)
Albumin: 3.9 g/dL (ref 3.5–5.2)
Alkaline Phosphatase: 62 U/L (ref 39–117)
BUN: 22 mg/dL (ref 6–23)
Calcium: 9.5 mg/dL (ref 8.4–10.5)
Chloride: 106 mEq/L (ref 96–112)
Potassium: 4.7 mEq/L (ref 3.5–5.3)
Sodium: 140 mEq/L (ref 135–145)
Total Protein: 6.7 g/dL (ref 6.0–8.3)

## 2012-11-08 LAB — TSH: TSH: 2.094 u[IU]/mL (ref 0.350–4.500)

## 2012-11-08 MED ORDER — DIAZEPAM 5 MG PO TABS: 5.0000 mg | ORAL_TABLET | Freq: Two times a day (BID) | ORAL | Status: DC | PRN

## 2012-11-08 MED ORDER — HYDROCODONE-ACETAMINOPHEN 5-325 MG PO TABS: 1.0000 | ORAL_TABLET | Freq: Four times a day (QID) | ORAL | Status: DC | PRN

## 2012-11-08 MED ORDER — MEMANTINE HCL ER 21 MG PO CP24: 21.0000 mg | ORAL_CAPSULE | Freq: Once | ORAL | Status: DC

## 2012-11-08 NOTE — Patient Instructions (Addendum)
Change Namenda to XR 21 mg daily. Continue other meds and return in 6 months.

## 2012-11-12 ENCOUNTER — Encounter: Payer: Self-pay | Admitting: Internal Medicine

## 2012-11-12 NOTE — Progress Notes (Signed)
  Subjective:    Patient ID: Edman Circle, female    DOB: 14-Jul-1919, 79 y.o.   MRN: [id number]  HPI 79 year-old white female with history of hypertension, osteoporosis, allergic rhinitis, anxiety, dementia, glaucoma, hearing loss, osteoarthritis, hypothyroidism and vitamin D deficiency in today for six-month recheck. She is accompanied by her daughter who is a Engineer, civil (consulting). Daughter feels that she continues to do fairly well residing alone with frequent checks from family members. Her blood pressure the bed elevated today but daughter says it is much better at home when she checks it. She repeats herself often. Works in the yard. Has not suffered any significant falls. Is not depressed. However does not know year but does know day of week. Does not know Economist. She is cooperative and friendly. Not a behavioral problem for her family. She is on Namenda and Aricept. Namenda needs to be changed XR. She is on Paxil for anxiety. Is on Altace  for hypertension. Takes hydrocodone/APAP sparingly for osteoarthritis pain. Takes thyroid replacement therapy. TSH will be checked today. History of insomnia for which she takes Restoril and seems to do very well with that despite her age. Occasionally takes Valium for anxiety but not frequently. Daughter reports appetite is good.    Review of Systems     Objective:   Physical Exam Skin is pale warm and dry. Nodes none. Neck is supple without thyromegaly. Chest clear to auscultation without rales or wheezing. Cardiac exam regular rate and rhythm normal S1 and S2. Extremities without edema. Neuro: No gross focal deficits on brief neurological exam except for issues with her memory. She repeatedly asked the same questions and spoke on the same topic.       Assessment & Plan:  Dementia-treated with Aricept and Namenda  Hypertension-seems to be better at home than in office setting  Anxiety-treated with Paxil and occasional value  Osteoporosis  History of  insomnia treated with Restoril and well tolerated despite her age  Osteoarthritis-takes hydrocodone/APAP sparingly for musculoskeletal pain  History of vitamin D deficiency  Hypothyroidism-TSH checked on thyroid replacement  Glaucoma  History of insomnia  Plan: Patient sent be doing well on current regimen. Changed Namenda from twice daily to XR 21 mg daily. Return in 6 months for physical exam. Order given for zoster vaccine.  Plan time spent with patient 25 minutes

## 2012-12-03 DIAGNOSIS — Z1231 Encounter for screening mammogram for malignant neoplasm of breast: Secondary | ICD-10-CM | POA: Diagnosis not present

## 2012-12-06 DIAGNOSIS — H409 Unspecified glaucoma: Secondary | ICD-10-CM | POA: Diagnosis not present

## 2012-12-06 DIAGNOSIS — H4011X0 Primary open-angle glaucoma, stage unspecified: Secondary | ICD-10-CM | POA: Diagnosis not present

## 2012-12-06 DIAGNOSIS — Z961 Presence of intraocular lens: Secondary | ICD-10-CM | POA: Diagnosis not present

## 2012-12-25 DIAGNOSIS — N6489 Other specified disorders of breast: Secondary | ICD-10-CM | POA: Diagnosis not present

## 2013-02-06 ENCOUNTER — Other Ambulatory Visit: Payer: Self-pay

## 2013-02-06 MED ORDER — SYNTHROID 50 MCG PO TABS: 50.0000 ug | ORAL_TABLET | Freq: Every day | ORAL | Status: DC

## 2013-02-12 DIAGNOSIS — H4011X0 Primary open-angle glaucoma, stage unspecified: Secondary | ICD-10-CM | POA: Diagnosis not present

## 2013-02-12 DIAGNOSIS — H04129 Dry eye syndrome of unspecified lacrimal gland: Secondary | ICD-10-CM | POA: Diagnosis not present

## 2013-02-12 DIAGNOSIS — H409 Unspecified glaucoma: Secondary | ICD-10-CM | POA: Diagnosis not present

## 2013-02-12 DIAGNOSIS — Z961 Presence of intraocular lens: Secondary | ICD-10-CM | POA: Diagnosis not present

## 2013-04-29 ENCOUNTER — Other Ambulatory Visit: Payer: Medicare Other | Admitting: Internal Medicine

## 2013-04-29 DIAGNOSIS — E559 Vitamin D deficiency, unspecified: Secondary | ICD-10-CM

## 2013-04-29 DIAGNOSIS — I10 Essential (primary) hypertension: Secondary | ICD-10-CM

## 2013-04-29 DIAGNOSIS — Z1329 Encounter for screening for other suspected endocrine disorder: Secondary | ICD-10-CM

## 2013-04-29 DIAGNOSIS — Z0000 Encounter for general adult medical examination without abnormal findings: Secondary | ICD-10-CM

## 2013-04-29 LAB — CBC WITH DIFFERENTIAL/PLATELET
Basophils Absolute: 0.1 10*3/uL (ref 0.0–0.1)
Basophils Relative: 1 % (ref 0–1)
Eosinophils Relative: 4 % (ref 0–5)
HCT: 35.8 % — ABNORMAL LOW (ref 36.0–46.0)
Hemoglobin: 11.3 g/dL — ABNORMAL LOW (ref 12.0–15.0)
Lymphocytes Relative: 27 % (ref 12–46)
Monocytes Absolute: 0.7 10*3/uL (ref 0.1–1.0)
Neutro Abs: 4.2 10*3/uL (ref 1.7–7.7)
Neutrophils Relative %: 59 % (ref 43–77)
Platelets: 314 10*3/uL (ref 150–400)
RDW: 15 % (ref 11.5–15.5)
WBC: 7.2 10*3/uL (ref 4.0–10.5)

## 2013-04-29 LAB — COMPREHENSIVE METABOLIC PANEL
ALT: 12 U/L (ref 0–35)
AST: 19 U/L (ref 0–37)
Alkaline Phosphatase: 69 U/L (ref 39–117)
CO2: 32 mEq/L (ref 19–32)
Creat: 0.84 mg/dL (ref 0.50–1.10)
Glucose, Bld: 110 mg/dL — ABNORMAL HIGH (ref 70–99)
Sodium: 138 mEq/L (ref 135–145)
Total Bilirubin: 0.3 mg/dL (ref 0.3–1.2)
Total Protein: 6.6 g/dL (ref 6.0–8.3)

## 2013-04-29 LAB — LIPID PANEL
HDL: 52 mg/dL (ref >39)
LDL Cholesterol: 85 mg/dL (ref 0–99)
VLDL: 44 mg/dL — ABNORMAL HIGH (ref 0–40)

## 2013-04-29 LAB — TSH: TSH: 2.921 u[IU]/mL (ref 0.350–4.500)

## 2013-04-30 LAB — VITAMIN D 25 HYDROXY (VIT D DEFICIENCY, FRACTURES): Vit D, 25-Hydroxy: 45 ng/mL (ref 30–89)

## 2013-05-02 ENCOUNTER — Encounter: Payer: Self-pay | Admitting: Internal Medicine

## 2013-05-02 ENCOUNTER — Ambulatory Visit (INDEPENDENT_AMBULATORY_CARE_PROVIDER_SITE_OTHER): Payer: Medicare Other | Admitting: Internal Medicine

## 2013-05-02 VITALS — BP 152/82 | HR 82 | Temp 98.8°F | Wt 128.0 lb

## 2013-05-02 DIAGNOSIS — E039 Hypothyroidism, unspecified: Secondary | ICD-10-CM

## 2013-05-02 DIAGNOSIS — R413 Other amnesia: Secondary | ICD-10-CM | POA: Diagnosis not present

## 2013-05-02 MED ORDER — RAMIPRIL 10 MG PO CAPS: 10.0000 mg | ORAL_CAPSULE | Freq: Every day | ORAL | Status: DC

## 2013-05-02 MED ORDER — LEVOTHYROXINE SODIUM 50 MCG PO TABS: 50.0000 ug | ORAL_TABLET | Freq: Every day | ORAL | Status: DC

## 2013-05-02 MED ORDER — PAROXETINE HCL 10 MG PO TABS: ORAL_TABLET | ORAL | Status: DC

## 2013-05-02 MED ORDER — MEMANTINE HCL ER 21 MG PO CP24: 21.0000 mg | ORAL_CAPSULE | Freq: Once | ORAL | Status: DC

## 2013-05-02 NOTE — Progress Notes (Signed)
   Subjective:    Patient ID: Laura Lowe, female    DOB: [birth year], 79 y.o.   MRN: [id number]  HPI  79 year old White female for 6 month recheck. Has not had antihypertensive meds this am. Has hx of dementia. Labs are good for her age. Slight increase in triglycerides of 220. TSH is WNL. Glucose was 110 but had mint before labs drawn. Daughter is with her today. She remains independent in her own home but daughter who lives close by checks on her frequently. Daughter feels that everything is safe at this point in time. Medical issues include hypertension, hypothyroidism, anxiety, dementia, glaucoma, hearing loss, osteoarthritis, vitamin D deficiency, osteoporosis.    Review of Systems     Objective:   Physical Exam She is pleasant and alert but repeats herself frequently. Skin is warm and dry. Nodes none. Neck is supple without JVD or thyromegaly. Chest clear to auscultation. Cardiac exam regular rate and rhythm. Extremities without edema.       Assessment & Plan:  Dementia-stable in about this and  Hypertension-has not had blood pressure medicines today  Anxiety-treated with Paxil  History of glaucoma  Hypothyroidism-on thyroid replacement therapy  Osteoporosis  History of vitamin D deficiency  Hearing loss  Plan: Return in 6 months for physical exam and continue same medications. 25 minutes spent with patient and daughter today.  Labs reviewed. Triglycerides are elevated at 220. She did have a mint before having her labs drawn. TSH is within normal limits. CBC and see better within normal limits with the exception of fasting glucose slightly elevated but she wasn't exactly fasting because of mint

## 2013-05-02 NOTE — Patient Instructions (Addendum)
Continue same meds and return in 6 months 

## 2013-07-07 ENCOUNTER — Telehealth: Payer: Self-pay | Admitting: Internal Medicine

## 2013-07-07 NOTE — Telephone Encounter (Signed)
Spoke with daughter, Lupita LeashDonna.  Gave appointment for Friday, 3/6 @ 4pm.  Daughter unable to do anything other than a late afternoon and preference was Friday.  Thursday she has meetings and not a good day for appointments.  Confirmed.

## 2013-07-07 NOTE — Telephone Encounter (Signed)
Appt next week.

## 2013-07-18 ENCOUNTER — Encounter: Payer: Self-pay | Admitting: Internal Medicine

## 2013-07-18 ENCOUNTER — Ambulatory Visit (INDEPENDENT_AMBULATORY_CARE_PROVIDER_SITE_OTHER): Payer: Medicare Other | Admitting: Internal Medicine

## 2013-07-18 VITALS — BP 148/86 | HR 80 | Temp 98.2°F | Wt 127.0 lb

## 2013-07-18 DIAGNOSIS — F03918 Unspecified dementia, unspecified severity, with other behavioral disturbance: Secondary | ICD-10-CM | POA: Diagnosis not present

## 2013-07-18 DIAGNOSIS — F0391 Unspecified dementia with behavioral disturbance: Secondary | ICD-10-CM | POA: Diagnosis not present

## 2013-07-18 DIAGNOSIS — F068 Other specified mental disorders due to known physiological condition: Secondary | ICD-10-CM | POA: Diagnosis not present

## 2013-07-18 MED ORDER — OLANZAPINE 5 MG PO TABS: 5.0000 mg | ORAL_TABLET | Freq: Every day | ORAL | Status: DC

## 2013-07-19 LAB — VITAMIN B12: Vitamin B-12: 547 pg/mL (ref 211–911)

## 2013-07-19 LAB — FOLATE: Folate: >20 ng/mL

## 2013-07-22 NOTE — Progress Notes (Signed)
Daughter informed

## 2013-09-21 DIAGNOSIS — E039 Hypothyroidism, unspecified: Secondary | ICD-10-CM | POA: Insufficient documentation

## 2013-09-26 ENCOUNTER — Other Ambulatory Visit: Payer: Self-pay | Admitting: Internal Medicine

## 2013-11-05 NOTE — Progress Notes (Addendum)
   Subjective:    Patient ID: Laura Lowe, female    DOB: 09-Jul-1919, 80 y.o.   MRN: [id number]  HPI  80 year old female in today accompanied by her daughter. Daughter feels dementia is getting a bit worse. She is on Namenda XR for dementia. She takes Valium for agitation in addition to Paxil for anxiety. She continues to live at home alone with frequent checks by daughter. No recent falls or issues with safety. Daughter would like to look into other causes of dementia. B12 and folate levels drawn today.    Review of Systems     Objective:   Physical Exam Patient is pleasant but forgetful. She is alert and today  is oriented to person place and time. No focal deficits on brief neurological exam. Neck is supple without JVD thyromegaly or carotid bruits. Chest clear to auscultation. Cardiac exam regular rate and rhythm normal S1 and S2. Extremities without edema. Skin is warm and dry.       Assessment & Plan:  Dementia  Plan: Continue Namenda  are as previously prescribed. B12 and folate levels drawn- proved to be within normal limits. Return in June for physical examination. Try Zyprexa for agitation at bedtime.  25 minutes spent with patient and daughter today.

## 2013-11-05 NOTE — Patient Instructions (Signed)
Continue same medications and return in 3 months. 

## 2013-11-10 ENCOUNTER — Other Ambulatory Visit: Payer: Medicare Other | Admitting: Internal Medicine

## 2013-11-11 ENCOUNTER — Ambulatory Visit (INDEPENDENT_AMBULATORY_CARE_PROVIDER_SITE_OTHER): Payer: Medicare Other | Admitting: Internal Medicine

## 2013-11-11 ENCOUNTER — Other Ambulatory Visit: Payer: Self-pay | Admitting: Internal Medicine

## 2013-11-11 ENCOUNTER — Encounter: Payer: Self-pay | Admitting: Internal Medicine

## 2013-11-11 ENCOUNTER — Other Ambulatory Visit: Payer: Medicare Other | Admitting: Internal Medicine

## 2013-11-11 VITALS — BP 130/70 | HR 80 | Temp 98.6°F | Ht <= 58 in | Wt 132.0 lb

## 2013-11-11 DIAGNOSIS — I10 Essential (primary) hypertension: Secondary | ICD-10-CM | POA: Diagnosis not present

## 2013-11-11 DIAGNOSIS — R413 Other amnesia: Secondary | ICD-10-CM

## 2013-11-11 DIAGNOSIS — D649 Anemia, unspecified: Secondary | ICD-10-CM | POA: Diagnosis not present

## 2013-11-11 DIAGNOSIS — Z8709 Personal history of other diseases of the respiratory system: Secondary | ICD-10-CM

## 2013-11-11 DIAGNOSIS — Z0000 Encounter for general adult medical examination without abnormal findings: Secondary | ICD-10-CM | POA: Diagnosis not present

## 2013-11-11 DIAGNOSIS — E559 Vitamin D deficiency, unspecified: Secondary | ICD-10-CM | POA: Diagnosis not present

## 2013-11-11 DIAGNOSIS — H9190 Unspecified hearing loss, unspecified ear: Secondary | ICD-10-CM

## 2013-11-11 DIAGNOSIS — Z8659 Personal history of other mental and behavioral disorders: Secondary | ICD-10-CM

## 2013-11-11 DIAGNOSIS — E039 Hypothyroidism, unspecified: Secondary | ICD-10-CM

## 2013-11-11 DIAGNOSIS — Z8639 Personal history of other endocrine, nutritional and metabolic disease: Secondary | ICD-10-CM

## 2013-11-11 DIAGNOSIS — R0989 Other specified symptoms and signs involving the circulatory and respiratory systems: Secondary | ICD-10-CM

## 2013-11-11 DIAGNOSIS — H409 Unspecified glaucoma: Secondary | ICD-10-CM | POA: Diagnosis not present

## 2013-11-11 DIAGNOSIS — H9193 Unspecified hearing loss, bilateral: Secondary | ICD-10-CM

## 2013-11-11 DIAGNOSIS — M810 Age-related osteoporosis without current pathological fracture: Secondary | ICD-10-CM

## 2013-11-11 LAB — LIPID PANEL
Cholesterol: 194 mg/dL (ref 0–200)
HDL: 60 mg/dL (ref >39)
LDL Cholesterol: 115 mg/dL — ABNORMAL HIGH (ref 0–99)
TRIGLYCERIDES: 96 mg/dL (ref <150)
Total CHOL/HDL Ratio: 3.2 Ratio
VLDL: 19 mg/dL (ref 0–40)

## 2013-11-11 LAB — CBC WITH DIFFERENTIAL/PLATELET
Basophils Absolute: 0.1 10*3/uL (ref 0.0–0.1)
Basophils Relative: 1 % (ref 0–1)
EOS ABS: 0.2 10*3/uL (ref 0.0–0.7)
EOS PCT: 2 % (ref 0–5)
HEMATOCRIT: 29.1 % — AB (ref 36.0–46.0)
Hemoglobin: 8.9 g/dL — ABNORMAL LOW (ref 12.0–15.0)
LYMPHS ABS: 1.9 10*3/uL (ref 0.7–4.0)
Lymphocytes Relative: 24 % (ref 12–46)
MCH: 24.9 pg — AB (ref 26.0–34.0)
MCHC: 30.6 g/dL (ref 30.0–36.0)
MCV: 81.3 fL (ref 78.0–100.0)
Monocytes Absolute: 0.8 10*3/uL (ref 0.1–1.0)
Monocytes Relative: 10 % (ref 3–12)
Neutro Abs: 5.1 10*3/uL (ref 1.7–7.7)
Neutrophils Relative %: 63 % (ref 43–77)
PLATELETS: 367 10*3/uL (ref 150–400)
RBC: 3.58 MIL/uL — AB (ref 3.87–5.11)
RDW: 15.1 % (ref 11.5–15.5)
WBC: 8.1 10*3/uL (ref 4.0–10.5)

## 2013-11-11 LAB — COMPREHENSIVE METABOLIC PANEL
ALT: 11 U/L (ref 0–35)
AST: 16 U/L (ref 0–37)
Albumin: 3.9 g/dL (ref 3.5–5.2)
Alkaline Phosphatase: 73 U/L (ref 39–117)
BILIRUBIN TOTAL: 0.3 mg/dL (ref 0.2–1.2)
BUN: 19 mg/dL (ref 6–23)
CO2: 27 mEq/L (ref 19–32)
Calcium: 9.3 mg/dL (ref 8.4–10.5)
Chloride: 105 mEq/L (ref 96–112)
Creat: 0.78 mg/dL (ref 0.50–1.10)
GLUCOSE: 93 mg/dL (ref 70–99)
Potassium: 4.5 mEq/L (ref 3.5–5.3)
Sodium: 141 mEq/L (ref 135–145)
Total Protein: 6.5 g/dL (ref 6.0–8.3)

## 2013-11-11 LAB — POCT URINALYSIS DIPSTICK
BILIRUBIN UA: NEGATIVE
Glucose, UA: NEGATIVE
KETONES UA: NEGATIVE
Leukocytes, UA: NEGATIVE
Nitrite, UA: NEGATIVE
PH UA: 5.5
Protein, UA: NEGATIVE
RBC UA: NEGATIVE
Spec Grav, UA: >=1.03
Urobilinogen, UA: NEGATIVE

## 2013-11-11 LAB — TSH: TSH: 2.411 u[IU]/mL (ref 0.350–4.500)

## 2013-11-11 MED ORDER — MEMANTINE HCL ER 28 MG PO CP24
28.0000 mg | ORAL_CAPSULE | ORAL | Status: DC
Start: 2013-11-11 — End: 2014-12-09

## 2013-11-11 NOTE — Patient Instructions (Signed)
Anemia studies pending. Increase Namenda to 28 mg daily. Continue Zyprexa for agitation. Continue her other medications. Will need repeat CBC in 6-8 weeks. Otherwise return in 6 months. 3 Hemoccult cards will be distributed
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
## 2013-11-12 LAB — VITAMIN D 25 HYDROXY (VIT D DEFICIENCY, FRACTURES): Vit D, 25-Hydroxy: 48 ng/mL (ref 30–89)

## 2013-11-12 LAB — IRON AND TIBC: UIBC: 472 ug/dL — ABNORMAL HIGH (ref 125–400)

## 2013-11-12 NOTE — Progress Notes (Signed)
Iron studies added

## 2013-11-13 NOTE — Progress Notes (Signed)
Daughter informed. Labs mailed. Will recheck labs in early September.

## 2013-12-18 ENCOUNTER — Encounter: Payer: Self-pay | Admitting: Internal Medicine

## 2013-12-18 ENCOUNTER — Ambulatory Visit
Admission: RE | Admit: 2013-12-18 | Discharge: 2013-12-18 | Disposition: A | Discharge status: Home / Self Care | Payer: Medicare Other | Source: Ambulatory Visit | Attending: Internal Medicine | Admitting: Internal Medicine

## 2013-12-18 ENCOUNTER — Ambulatory Visit (INDEPENDENT_AMBULATORY_CARE_PROVIDER_SITE_OTHER): Payer: Medicare Other | Admitting: Internal Medicine

## 2013-12-18 VITALS — BP 136/68 | HR 76 | Temp 99.7°F | Wt 132.0 lb

## 2013-12-18 DIAGNOSIS — R413 Other amnesia: Secondary | ICD-10-CM

## 2013-12-18 DIAGNOSIS — J189 Pneumonia, unspecified organism: Secondary | ICD-10-CM

## 2013-12-18 DIAGNOSIS — J181 Lobar pneumonia, unspecified organism: Secondary | ICD-10-CM

## 2013-12-18 DIAGNOSIS — R0989 Other specified symptoms and signs involving the circulatory and respiratory systems: Secondary | ICD-10-CM | POA: Diagnosis not present

## 2013-12-18 MED ORDER — BENZONATATE 200 MG PO CAPS: 200.0000 mg | ORAL_CAPSULE | Freq: Three times a day (TID) | ORAL | Status: DC | PRN

## 2013-12-18 MED ORDER — CLARITHROMYCIN 500 MG PO TABS: 500.0000 mg | ORAL_TABLET | Freq: Two times a day (BID) | ORAL | Status: DC

## 2013-12-18 MED ORDER — CEFTRIAXONE SODIUM 1 G IJ SOLR
1.0000 g | Freq: Once | INTRAMUSCULAR | Status: AC
  Administered 2013-12-18: 1 g via INTRAMUSCULAR

## 2013-12-18 NOTE — Patient Instructions (Addendum)
Rocephin 1 g IM given. Take Biaxin  500 mg twice daily for 10 days. Return in one week. Take Tessalon Perles as needed for cough.

## 2013-12-25 ENCOUNTER — Ambulatory Visit: Payer: Medicare Other | Admitting: Internal Medicine

## 2013-12-29 ENCOUNTER — Encounter: Payer: Self-pay | Admitting: Internal Medicine

## 2013-12-29 ENCOUNTER — Ambulatory Visit (INDEPENDENT_AMBULATORY_CARE_PROVIDER_SITE_OTHER): Payer: Medicare Other | Admitting: Internal Medicine

## 2013-12-29 VITALS — BP 146/70 | HR 80 | Temp 99.0°F | Wt 135.0 lb

## 2013-12-29 DIAGNOSIS — Z23 Encounter for immunization: Secondary | ICD-10-CM

## 2013-12-29 DIAGNOSIS — D509 Iron deficiency anemia, unspecified: Secondary | ICD-10-CM

## 2013-12-29 DIAGNOSIS — J189 Pneumonia, unspecified organism: Secondary | ICD-10-CM

## 2013-12-29 DIAGNOSIS — R413 Other amnesia: Secondary | ICD-10-CM | POA: Diagnosis not present

## 2013-12-29 DIAGNOSIS — J181 Lobar pneumonia, unspecified organism: Secondary | ICD-10-CM

## 2013-12-29 MED ORDER — PNEUMOCOCCAL 13-VAL CONJ VACC IM SUSP: 0.5000 mL | INTRAMUSCULAR | Status: DC

## 2013-12-29 NOTE — Progress Notes (Signed)
   Subjective:    Patient ID: Laura Lowe, female    DOB: 04-Feb-1920, 80 y.o.   MRN: [id number]  HPI  For followup today of pneumonia. She and her daughter went to the beach. She is feeling much better. No complaint of shortness of breath, cough or wheezing. Got a bit confused at the beach house which is not unusual for dementia. Remains on Namenda. Now living with daughter full-time. Daughter is giving her iron supplementation orally. Appetite okay. Daughter says she responded quickly to antibiotics.    Review of Systems     Objective:   Physical Exam  Chest clear to auscultation without rales or wheezing. Alert. Repeats herself frequently. Hard of hearing. Cardiac exam regular rate and rhythm. Extremities without edema. Skin is warm and dry.      Assessment & Plan:  Resolving right lower lobe pneumonia  Dementia  Iron deficiency anemia  Plan: Repeat chest x-ray tomorrow. Prevnar 23 given today. Followup on iron deficiency anemia with CBC, iron and iron-binding capacity drawn today  25 minutes spent with patient and daughter.

## 2013-12-29 NOTE — Patient Instructions (Addendum)
Please have repeat chest x-ray. Prevnar 23 given today. Return when necessary. CBC drawn today in followup of iron deficiency anemia.

## 2013-12-30 ENCOUNTER — Ambulatory Visit
Admission: RE | Admit: 2013-12-30 | Discharge: 2013-12-30 | Disposition: A | Discharge status: Home / Self Care | Payer: Medicare Other | Source: Ambulatory Visit | Attending: Internal Medicine | Admitting: Internal Medicine

## 2013-12-30 DIAGNOSIS — J189 Pneumonia, unspecified organism: Secondary | ICD-10-CM

## 2013-12-30 DIAGNOSIS — K449 Diaphragmatic hernia without obstruction or gangrene: Secondary | ICD-10-CM | POA: Diagnosis not present

## 2013-12-30 LAB — IRON AND TIBC
%SAT: 13 % — ABNORMAL LOW (ref 20–55)
Iron: 50 ug/dL (ref 42–145)
TIBC: 399 ug/dL (ref 250–470)
UIBC: 349 ug/dL (ref 125–400)

## 2013-12-30 LAB — CBC WITH DIFFERENTIAL/PLATELET
BASOS ABS: 0.1 10*3/uL (ref 0.0–0.1)
BASOS PCT: 1 % (ref 0–1)
EOS ABS: 0.2 10*3/uL (ref 0.0–0.7)
EOS PCT: 4 % (ref 0–5)
HEMATOCRIT: 33.1 % — AB (ref 36.0–46.0)
HEMOGLOBIN: 10.7 g/dL — AB (ref 12.0–15.0)
Lymphocytes Relative: 26 % (ref 12–46)
Lymphs Abs: 1.5 10*3/uL (ref 0.7–4.0)
MCH: 26.3 pg (ref 26.0–34.0)
MCHC: 32.3 g/dL (ref 30.0–36.0)
MCV: 81.3 fL (ref 78.0–100.0)
MONO ABS: 0.6 10*3/uL (ref 0.1–1.0)
MONOS PCT: 11 % (ref 3–12)
Neutro Abs: 3.4 10*3/uL (ref 1.7–7.7)
Neutrophils Relative %: 58 % (ref 43–77)
Platelets: 313 10*3/uL (ref 150–400)
RBC: 4.07 MIL/uL (ref 3.87–5.11)
RDW: 18.8 % — AB (ref 11.5–15.5)
WBC: 5.8 10*3/uL (ref 4.0–10.5)

## 2014-01-07 NOTE — Progress Notes (Signed)
   Subjective:    Patient ID: Laura Lowe, female    DOB: 08/16/1919, 80 y.o.   MRN: [id number]  HPI  [place] Flesher to the beach with daughter. Subsequently developed cough and congestion. Has not felt well and cough simply will not go away with conservative treatment. History of dementia. Now living with daughter and doing fairly well.    Review of Systems     Objective:   Physical Exam skin pale warm and dry. Nodes none. Pharynx noninjected. TMs are clear. Neck is supple without significant adenopathy. Chest rales noted right lower lobe. Chest x-ray ordered. Extremities without edema        Assessment & Plan:   Right lower lobe pneumonia-new minimal patchy opacity right lower lobe  History of dementia  Plan: Biaxin 500 mg twice daily for 10 days and return in 1 week. Rocephin 1 g IM given today. Tessalon Perles  3 times a day when necessary cough

## 2014-02-10 ENCOUNTER — Other Ambulatory Visit: Payer: Self-pay

## 2014-02-10 MED ORDER — LEVOTHYROXINE SODIUM 50 MCG PO TABS: 50.0000 ug | ORAL_TABLET | Freq: Every day | ORAL | Status: DC

## 2014-02-10 NOTE — Telephone Encounter (Signed)
Levothyroxine sent to [facility] pharmacy.

## 2014-02-17 DIAGNOSIS — B351 Tinea unguium: Secondary | ICD-10-CM | POA: Diagnosis not present

## 2014-02-17 DIAGNOSIS — M79674 Pain in right toe(s): Secondary | ICD-10-CM | POA: Diagnosis not present

## 2014-02-17 DIAGNOSIS — M79675 Pain in left toe(s): Secondary | ICD-10-CM | POA: Diagnosis not present

## 2014-04-09 ENCOUNTER — Other Ambulatory Visit: Payer: Self-pay | Admitting: Internal Medicine

## 2014-05-10 ENCOUNTER — Other Ambulatory Visit: Payer: Self-pay | Admitting: Internal Medicine

## 2014-05-19 ENCOUNTER — Ambulatory Visit: Payer: Medicare Other | Admitting: Internal Medicine

## 2014-05-19 DIAGNOSIS — M79674 Pain in right toe(s): Secondary | ICD-10-CM | POA: Diagnosis not present

## 2014-05-19 DIAGNOSIS — B351 Tinea unguium: Secondary | ICD-10-CM | POA: Diagnosis not present

## 2014-05-19 DIAGNOSIS — M79675 Pain in left toe(s): Secondary | ICD-10-CM | POA: Diagnosis not present

## 2014-05-25 ENCOUNTER — Ambulatory Visit (INDEPENDENT_AMBULATORY_CARE_PROVIDER_SITE_OTHER): Payer: Medicare Other | Admitting: Internal Medicine

## 2014-05-25 ENCOUNTER — Encounter: Payer: Self-pay | Admitting: Internal Medicine

## 2014-05-25 VITALS — BP 128/74 | HR 82 | Temp 98.4°F | Wt 145.0 lb

## 2014-05-25 DIAGNOSIS — I10 Essential (primary) hypertension: Secondary | ICD-10-CM | POA: Diagnosis not present

## 2014-05-25 DIAGNOSIS — Z862 Personal history of diseases of the blood and blood-forming organs and certain disorders involving the immune mechanism: Secondary | ICD-10-CM | POA: Diagnosis not present

## 2014-05-25 DIAGNOSIS — E039 Hypothyroidism, unspecified: Secondary | ICD-10-CM

## 2014-05-25 DIAGNOSIS — F411 Generalized anxiety disorder: Secondary | ICD-10-CM | POA: Diagnosis not present

## 2014-05-25 DIAGNOSIS — R413 Other amnesia: Secondary | ICD-10-CM

## 2014-05-25 LAB — TSH: TSH: 1.425 u[IU]/mL (ref 0.350–4.500)

## 2014-05-25 NOTE — Patient Instructions (Signed)
Return in 6 months or as needed. Continue same medications.

## 2014-05-25 NOTE — Progress Notes (Signed)
   Subjective:    Patient ID: Laura Lowe, female    DOB: 1919-10-15, 81 y.o.   MRN: [id number]  HPI  81 year old Female for 6 month recheck. Hx of anemia, HTN, hypothyroidism and memory loss. Resides with her daughter who is a Engineer, civil (consulting)nurse. She has been doing fairly well. Zyprexa has helped a great deal with regard to agitation and anxiety. Sometimes gets upset and cries. Would rather be at her own home where she enjoyed her yard but it is no longer possible for her to live alone. Does stay awake at a time with daughter in [place] to give daughter here a break.    Review of Systems     Objective:   Physical Exam She is alert but not oriented to day of the week, month or year. She recognizes me and knows me. Skin is pale warm and dry. Nodes none. No JVD thyromegaly or carotid bruits. Chest clear to auscultation. Cardiac exam regular rate and rhythm. Extremities without edema. She is ambulatory.       Assessment & Plan:  Dementia-currently on treatment  Hypertension-stable on current regimen  Hypothyroidism-stable on thyroid replacement  History of iron deficiency anemia-last visit anemia had improved considerably has had iron level  Anxiety related to dementia treated with Zyprexa  Plan: Continue same medications and return in 6 months  25 minutes spent with patient and her daughter in evaluation and coordination of care

## 2014-05-27 ENCOUNTER — Encounter: Payer: Self-pay | Admitting: Internal Medicine

## 2014-07-02 ENCOUNTER — Ambulatory Visit (INDEPENDENT_AMBULATORY_CARE_PROVIDER_SITE_OTHER): Payer: Medicare Other | Admitting: Internal Medicine

## 2014-07-02 ENCOUNTER — Encounter: Payer: Self-pay | Admitting: Internal Medicine

## 2014-07-02 ENCOUNTER — Ambulatory Visit
Admission: RE | Admit: 2014-07-02 | Discharge: 2014-07-02 | Disposition: A | Discharge status: Home / Self Care | Payer: Medicare Other | Source: Ambulatory Visit | Attending: Internal Medicine | Admitting: Internal Medicine

## 2014-07-02 VITALS — BP 120/74 | HR 88 | Temp 98.0°F | Wt 149.0 lb

## 2014-07-02 DIAGNOSIS — K449 Diaphragmatic hernia without obstruction or gangrene: Secondary | ICD-10-CM | POA: Diagnosis not present

## 2014-07-02 DIAGNOSIS — J209 Acute bronchitis, unspecified: Secondary | ICD-10-CM

## 2014-07-02 DIAGNOSIS — J9801 Acute bronchospasm: Secondary | ICD-10-CM

## 2014-07-02 DIAGNOSIS — R062 Wheezing: Secondary | ICD-10-CM

## 2014-07-02 MED ORDER — CLARITHROMYCIN 500 MG PO TABS: 500.0000 mg | ORAL_TABLET | Freq: Two times a day (BID) | ORAL | Status: DC

## 2014-07-02 MED ORDER — CEFTRIAXONE SODIUM 1 G IJ SOLR
1.0000 g | Freq: Once | INTRAMUSCULAR | Status: AC
  Administered 2014-07-02: 1 g via INTRAMUSCULAR

## 2014-07-02 MED ORDER — IPRATROPIUM-ALBUTEROL 0.5-2.5 (3) MG/3ML IN SOLN
3.0000 mL | Freq: Once | RESPIRATORY_TRACT | Status: AC
  Administered 2014-07-02: 3 mL via RESPIRATORY_TRACT

## 2014-07-02 NOTE — Progress Notes (Signed)
   Subjective:    Patient ID: Laura Lowe, female    DOB: [birth year], 81 y.o.   MRN: [id number]  HPI 81 year old White female who had pneumonia in August 2015 now in today with respiratory infection symptoms. Has been wheezing. Pulse oximetry is 95% on room air. She doesn't realize what is wrong. Caretaker has noticed some loud audible wheezing. No fever.  Patient has dementia. Keeps asking over and  over what is wrong with her.  Review of Systems     Objective:   Physical Exam Bilateral wheezing on inspiration. Was given a DuoNeb nebulizer treatment with improvement in wheezing. No JVD thyromegaly or carotid bruits. No lower extremity edema. Cardiac exam regular rate and rhythm.        Assessment & Plan:  Acute bronchitis  Acute bronchospasm  Plan: Biaxin 500 mg twice daily for 10 days. Nebulizer treatment given in office. Rocephin 1 g IM given in office. To have chest x-ray now.

## 2014-07-02 NOTE — Addendum Note (Signed)
Addended by: Thomasena EdisWILLIAMS, Daleyza Gadomski N on: 07/02/2014 03:31 PM   Modules accepted: Orders

## 2014-07-02 NOTE — Patient Instructions (Addendum)
Have CXR. Biaxin 500 mg bid x 10 days. Rocephin one gram given IM.

## 2014-07-03 ENCOUNTER — Telehealth: Payer: Self-pay | Admitting: *Deleted

## 2014-07-03 NOTE — Telephone Encounter (Signed)
Reviewed xray results with Lupita Leashonna

## 2014-07-25 ENCOUNTER — Other Ambulatory Visit: Payer: Self-pay | Admitting: Internal Medicine

## 2014-08-05 ENCOUNTER — Other Ambulatory Visit: Payer: Self-pay | Admitting: Internal Medicine

## 2014-10-12 ENCOUNTER — Other Ambulatory Visit: Payer: Self-pay | Admitting: Internal Medicine

## 2014-10-23 ENCOUNTER — Encounter: Payer: Self-pay | Admitting: Internal Medicine

## 2014-10-23 ENCOUNTER — Ambulatory Visit (INDEPENDENT_AMBULATORY_CARE_PROVIDER_SITE_OTHER): Payer: Medicare Other | Admitting: Internal Medicine

## 2014-10-23 VITALS — BP 136/78 | HR 94 | Temp 97.8°F | Wt 142.0 lb

## 2014-10-23 DIAGNOSIS — J069 Acute upper respiratory infection, unspecified: Secondary | ICD-10-CM | POA: Diagnosis not present

## 2014-10-23 MED ORDER — CEFTRIAXONE SODIUM 1 G IJ SOLR
1.0000 g | Freq: Once | INTRAMUSCULAR | Status: AC
  Administered 2014-10-23: 1 g via INTRAMUSCULAR

## 2014-10-23 MED ORDER — AEROCHAMBER PLUS W/MASK MISC: Status: AC

## 2014-10-23 MED ORDER — ALBUTEROL SULFATE HFA 108 (90 BASE) MCG/ACT IN AERS: 2.0000 | INHALATION_SPRAY | Freq: Four times a day (QID) | RESPIRATORY_TRACT | Status: AC | PRN

## 2014-10-23 MED ORDER — BENZONATATE 200 MG PO CAPS: 200.0000 mg | ORAL_CAPSULE | Freq: Three times a day (TID) | ORAL | Status: DC | PRN

## 2014-10-23 MED ORDER — CLARITHROMYCIN 500 MG PO TABS: 500.0000 mg | ORAL_TABLET | Freq: Two times a day (BID) | ORAL | Status: DC

## 2014-10-23 NOTE — Progress Notes (Signed)
   Subjective:    Patient ID: Laura Lowe, female    DOB: August 07, 1919, 81 y.o.   MRN: [id number]  HPI Brought in by daughter with cough and congestion for several days. Not responded over-the-counter medication. No documented fever. Daughter is a Engineer, civil (consulting). Has not seemed to be short of breath except with walking. Doesn't exercise or go out in the yard is much as she use to. Forgets that she has eaten and will eat again. Daughter tried to hide food and follow she is at work but patient is frequently brought candy by visitors. Actually weigh 7 pounds less than she did in February when she had similar presentation with cough and wheezing. Long-standing history of Alzheimer's type dementia. Daughter prefers not to have chest x-ray if not absolutely necessary. Patient not his vocal as she was previously.    Review of Systems     Objective:   Physical Exam  Skin warm and dry. Nodes none. Pulse oximetry is normal. Neck is supple without JVD . Chest bilateral rhonchi and wheezing. No acute distress. Cardiac exam regular rate and rhythm. Extremities without edema.      Assessment & Plan:  Acute URI  Bronchospasm  Plan: Rocephin 1 g IM. Tessalon Perles for cough refilled prescription. Biaxin 500 mg twice daily for 10 days. Albuterol inhaler with AeroChamber   to use 2 sprays 4 times daily

## 2014-10-23 NOTE — Patient Instructions (Signed)
1 g IM Rocephin given. Albuterol inhaler and AeroChamber 2 sprays by mouth 4 times daily as needed for wheezing. Biaxin 500 mg twice daily for 10 days. Tessalon Perles as needed for cough.

## 2014-11-20 ENCOUNTER — Ambulatory Visit: Payer: Medicare Other | Admitting: Internal Medicine

## 2014-12-09 ENCOUNTER — Other Ambulatory Visit: Payer: Self-pay | Admitting: Internal Medicine

## 2014-12-15 ENCOUNTER — Encounter: Payer: Self-pay | Admitting: Podiatry

## 2014-12-15 ENCOUNTER — Ambulatory Visit (INDEPENDENT_AMBULATORY_CARE_PROVIDER_SITE_OTHER): Payer: Medicare Other | Admitting: Podiatry

## 2014-12-15 ENCOUNTER — Ambulatory Visit: Payer: Medicare Other | Admitting: Podiatry

## 2014-12-15 DIAGNOSIS — B351 Tinea unguium: Secondary | ICD-10-CM | POA: Diagnosis not present

## 2014-12-15 DIAGNOSIS — M79676 Pain in unspecified toe(s): Secondary | ICD-10-CM | POA: Diagnosis not present

## 2014-12-15 NOTE — Progress Notes (Signed)
Patient ID: Laura Lowe, female   DOB: 1919-05-29, 81 y.o.   MRN: [id number] Complaint:  Visit Type: Patient returns to my office for continued preventative foot care services. Complaint: Patient states" my nails have grown long and thick and become painful to walk and wear shoes". The patient presents for preventative foot care services. No changes to ROS  Podiatric Exam: Vascular: dorsalis pedis and posterior tibial pulses are not  palpable bilateral. Capillary return is immediate. Temperature gradient is WNL. Skin turgor WNL  Sensorium: Normal Semmes Weinstein monofilament test. Normal tactile sensation bilaterally. Nail Exam: Pt has thick disfigured discolored nails with subungual debris noted bilateral entire nail hallux through fifth toenails Ulcer Exam: There is no evidence of ulcer or pre-ulcerative changes or infection. Orthopedic Exam: Muscle tone and strength are WNL. No limitations in general ROM. No crepitus or effusions noted. Foot type and digits show no abnormalities. Bony prominences are unremarkable. Skin: No Porokeratosis. No infection or ulcers  Diagnosis:  Onychomycosis, , Pain in right toe, pain in left toes  Treatment & Plan Procedures and Treatment: Consent by patient was obtained for treatment procedures. The patient understood the discussion of treatment and procedures well. All questions were answered thoroughly reviewed. Debridement of mycotic and hypertrophic toenails, 1 through 5 bilateral and clearing of subungual debris. No ulceration, no infection noted.  Return Visit-Office Procedure: Patient instructed to return to the office for a follow up visit 3 months for continued evaluation and treatment.

## 2015-01-04 ENCOUNTER — Ambulatory Visit: Payer: Medicare Other | Admitting: Internal Medicine

## 2015-01-04 ENCOUNTER — Telehealth: Payer: Self-pay | Admitting: Internal Medicine

## 2015-01-04 NOTE — Telephone Encounter (Signed)
Laura Lowe, daughter called @ 1040 and states that patient is in [facility] with her other daughter.  She was going to come home over the weekend.  She didn't make it home.  However, she is doing well.  States that their understanding of this visit today was a follow up to her bronchitis, etc.  We have today's visit as 6 mo f/u and CPE labs.  Daughter states that patient is doing well with the typical symptoms and signs of dementia.  Cancelling appointment for today.  She will call back and R/S appointment once her Mother is back here in [place].

## 2015-01-22 ENCOUNTER — Ambulatory Visit: Payer: Medicare Other | Admitting: Podiatry

## 2015-01-22 ENCOUNTER — Other Ambulatory Visit: Payer: Self-pay | Admitting: Internal Medicine

## 2015-01-22 NOTE — Telephone Encounter (Signed)
Refuse did not book recheck appt. Missed last appt

## 2015-02-07 ENCOUNTER — Other Ambulatory Visit: Payer: Self-pay | Admitting: Internal Medicine

## 2015-04-06 ENCOUNTER — Other Ambulatory Visit: Payer: Self-pay | Admitting: Internal Medicine

## 2015-04-26 ENCOUNTER — Other Ambulatory Visit: Payer: Self-pay

## 2015-04-26 MED ORDER — OLANZAPINE 5 MG PO TABS: 5.0000 mg | ORAL_TABLET | Freq: Every day | ORAL | Status: DC

## 2015-04-26 NOTE — Telephone Encounter (Signed)
appt scheduled for 12/22 @11am - patient will be out of medication on Fri 12/16

## 2015-05-06 ENCOUNTER — Ambulatory Visit: Payer: Medicare Other | Admitting: Internal Medicine

## 2015-05-07 ENCOUNTER — Encounter: Payer: Self-pay | Admitting: Internal Medicine

## 2015-05-07 ENCOUNTER — Ambulatory Visit (INDEPENDENT_AMBULATORY_CARE_PROVIDER_SITE_OTHER): Payer: Medicare Other | Admitting: Internal Medicine

## 2015-05-07 VITALS — BP 112/66 | HR 70 | Temp 97.0°F | Resp 20 | Wt 119.0 lb

## 2015-05-07 DIAGNOSIS — Z79899 Other long term (current) drug therapy: Secondary | ICD-10-CM

## 2015-05-07 DIAGNOSIS — Z23 Encounter for immunization: Secondary | ICD-10-CM | POA: Diagnosis not present

## 2015-05-07 DIAGNOSIS — E559 Vitamin D deficiency, unspecified: Secondary | ICD-10-CM | POA: Diagnosis not present

## 2015-05-07 DIAGNOSIS — E039 Hypothyroidism, unspecified: Secondary | ICD-10-CM

## 2015-05-07 DIAGNOSIS — M810 Age-related osteoporosis without current pathological fracture: Secondary | ICD-10-CM

## 2015-05-07 DIAGNOSIS — E7800 Pure hypercholesterolemia, unspecified: Secondary | ICD-10-CM

## 2015-05-07 DIAGNOSIS — R413 Other amnesia: Secondary | ICD-10-CM

## 2015-05-07 DIAGNOSIS — Z862 Personal history of diseases of the blood and blood-forming organs and certain disorders involving the immune mechanism: Secondary | ICD-10-CM

## 2015-05-07 DIAGNOSIS — I10 Essential (primary) hypertension: Secondary | ICD-10-CM | POA: Diagnosis not present

## 2015-05-07 LAB — CBC WITH DIFFERENTIAL/PLATELET
BASOS PCT: 1 % (ref 0–1)
Basophils Absolute: 0.1 10*3/uL (ref 0.0–0.1)
EOS PCT: 5 % (ref 0–5)
Eosinophils Absolute: 0.4 10*3/uL (ref 0.0–0.7)
HCT: 22.5 % — ABNORMAL LOW (ref 36.0–46.0)
Hemoglobin: 5.6 g/dL — CL (ref 12.0–15.0)
Lymphocytes Relative: 38 % (ref 12–46)
Lymphs Abs: 2.7 10*3/uL (ref 0.7–4.0)
MCH: 15.7 pg — ABNORMAL LOW (ref 26.0–34.0)
MCHC: 24.9 g/dL — ABNORMAL LOW (ref 30.0–36.0)
MCV: 63.2 fL — ABNORMAL LOW (ref 78.0–100.0)
MPV: 9.6 fL (ref 8.6–12.4)
Monocytes Absolute: 0.7 10*3/uL (ref 0.1–1.0)
Monocytes Relative: 10 % (ref 3–12)
Neutro Abs: 3.3 10*3/uL (ref 1.7–7.7)
Neutrophils Relative %: 46 % (ref 43–77)
Platelets: 471 10*3/uL — ABNORMAL HIGH (ref 150–400)
RBC: 3.56 MIL/uL — AB (ref 3.87–5.11)
RDW: 19 % — AB (ref 11.5–15.5)
WBC: 7.1 10*3/uL (ref 4.0–10.5)

## 2015-05-07 LAB — COMPLETE METABOLIC PANEL WITH GFR
ALT: 7 U/L (ref 6–29)
AST: 12 U/L (ref 10–35)
Albumin: 3.6 g/dL (ref 3.6–5.1)
Alkaline Phosphatase: 72 U/L (ref 33–130)
BUN: 29 mg/dL — AB (ref 7–25)
CALCIUM: 9.1 mg/dL (ref 8.6–10.4)
CHLORIDE: 112 mmol/L — AB (ref 98–110)
CO2: 22 mmol/L (ref 20–31)
Creat: 1.67 mg/dL — ABNORMAL HIGH (ref 0.60–0.88)
GFR, Est African American: 30 mL/min — ABNORMAL LOW (ref >=60)
GFR, Est Non African American: 26 mL/min — ABNORMAL LOW (ref >=60)
GLUCOSE: 89 mg/dL (ref 65–99)
POTASSIUM: 4.7 mmol/L (ref 3.5–5.3)
SODIUM: 143 mmol/L (ref 135–146)
Total Bilirubin: 0.3 mg/dL (ref 0.2–1.2)
Total Protein: 6.7 g/dL (ref 6.1–8.1)

## 2015-05-07 LAB — LIPID PANEL
CHOL/HDL RATIO: 3.1 ratio (ref <=5.0)
CHOLESTEROL: 135 mg/dL (ref 125–200)
HDL: 43 mg/dL — ABNORMAL LOW (ref >=46)
LDL Cholesterol: 68 mg/dL (ref <130)
Triglycerides: 121 mg/dL (ref <150)
VLDL: 24 mg/dL (ref <30)

## 2015-05-07 LAB — TSH: TSH: 3.06 u[IU]/mL (ref 0.350–4.500)

## 2015-05-07 MED ORDER — LEVOTHYROXINE SODIUM 50 MCG PO TABS: 50.0000 ug | ORAL_TABLET | Freq: Every day | ORAL | Status: DC

## 2015-05-07 MED ORDER — DIAZEPAM 5 MG PO TABS: 5.0000 mg | ORAL_TABLET | Freq: Two times a day (BID) | ORAL | Status: DC | PRN

## 2015-05-08 ENCOUNTER — Telehealth: Payer: Self-pay | Admitting: Internal Medicine

## 2015-05-08 LAB — VITAMIN D 25 HYDROXY (VIT D DEFICIENCY, FRACTURES): Vit D, 25-Hydroxy: 29 ng/mL — ABNORMAL LOW (ref 30–100)

## 2015-05-08 NOTE — Telephone Encounter (Signed)
Having trouble chewing according to daughter. Hemoglobin is very low at 5.6 with low MCV. Has not been taking iron consistently. Is constipated when she takes it. Does not eat a lot of iron rich foods. Likes cookies. Daughter is going to try iron sulfate 325 mg twice daily and bring her back in 2 weeks for follow-up. With regard to severe anemia, she is completely asymptomatic other than feeling cold a lot. Her TSH was within normal limits.

## 2015-05-12 ENCOUNTER — Encounter: Payer: Self-pay | Admitting: Internal Medicine

## 2015-05-12 NOTE — Patient Instructions (Addendum)
Patient is most likely iron deficient and significantly anemic. Daughter will restart iron supplement and return in 2 weeks. Continue same medications.

## 2015-05-12 NOTE — Progress Notes (Signed)
   Subjective:    Patient ID: Laura Lowe, female    DOB: 1919-11-24, 81 y.o.   MRN: [id number]  HPI 81 year old White Female with history of dementia, anemia, essential hypertension, hypothyroidism in today for six-month recheck. Accompanied by her daughter, Laura Lowe. Daughter admits that patient doesn't eat as well as she should. Likes a lot of sweets, particularly cookies. Daughter spoke with me today at length about DO NOT RESUSCITATE order and not being aggressive with her mother given her dementia and age. She mostly resides with daughter, Laura Lowe, here in [place].  Sometimes travels to [place] and stays a week or so with another daughter. Recently this seemed to confuse her a bit when she traveled to [place]. Patient is pleasant and cooperative. Has no complaints today. Zyprexa helps with agitation.    Review of Systems no complaints     Objective:   Physical Exam Skin warm and dry. Nodes none. She is ambulatory without assistance. Neck is supple without JVD thyromegaly or carotid bruits. Chest clear. Cardiac exam regular rate and rhythm. Extremities without edema. Brief neurological exam no focal deficits.       Assessment & Plan:   History of iron deficiency anemia  Memory loss treated with Namenda.  Agitation with dementia treated with Zyprexa and Valium as well as Paxil  Hypothyroidism-on thyroid replacement therapy  Essential hypertension treated with Altace  Plan: Lab called saying that patient was severely anemic with Hemoglobin 5.2. MCV 63.2. This is consistent with probable iron deficiency. Called daughter, Laura Lowe to discuss this with her. She doesn't want to give her IV iron or transfuse her at this point in time. Says patient appears to be asymptomatic with this anemia. Likely is chronic with low MCV. Agrees to start iron supplement in follow-up in 2 weeks. If she does have GI blood loss, we do not want to pursue a workup at this point in time given  her age and multiple medical problems.

## 2015-05-17 ENCOUNTER — Other Ambulatory Visit: Payer: Self-pay | Admitting: Internal Medicine

## 2015-05-27 ENCOUNTER — Other Ambulatory Visit: Payer: Self-pay | Admitting: Internal Medicine

## 2015-05-28 ENCOUNTER — Other Ambulatory Visit: Payer: Medicare Other | Admitting: Internal Medicine

## 2015-05-28 DIAGNOSIS — D649 Anemia, unspecified: Secondary | ICD-10-CM | POA: Diagnosis not present

## 2015-05-29 LAB — CBC WITH DIFFERENTIAL/PLATELET
BASOS ABS: 0.1 10*3/uL (ref 0.0–0.1)
BASOS PCT: 1 % (ref 0–1)
EOS ABS: 0.3 10*3/uL (ref 0.0–0.7)
Eosinophils Relative: 5 % (ref 0–5)
HCT: 27.2 % — ABNORMAL LOW (ref 36.0–46.0)
Hemoglobin: 7.2 g/dL — ABNORMAL LOW (ref 12.0–15.0)
Lymphocytes Relative: 24 % (ref 12–46)
Lymphs Abs: 1.5 10*3/uL (ref 0.7–4.0)
MCH: 19.9 pg — AB (ref 26.0–34.0)
MCHC: 26.5 g/dL — AB (ref 30.0–36.0)
MCV: 75.1 fL — ABNORMAL LOW (ref 78.0–100.0)
MONO ABS: 0.6 10*3/uL (ref 0.1–1.0)
MONOS PCT: 9 % (ref 3–12)
MPV: 10.1 fL (ref 8.6–12.4)
NEUTROS PCT: 61 % (ref 43–77)
Neutro Abs: 3.8 10*3/uL (ref 1.7–7.7)
Platelets: 340 10*3/uL (ref 150–400)
RBC: 3.62 MIL/uL — ABNORMAL LOW (ref 3.87–5.11)
WBC: 6.3 10*3/uL (ref 4.0–10.5)

## 2015-07-06 ENCOUNTER — Other Ambulatory Visit: Payer: Self-pay

## 2015-07-06 MED ORDER — MEMANTINE HCL ER 28 MG PO CP24: 28.0000 mg | ORAL_CAPSULE | Freq: Every day | ORAL | Status: DC

## 2015-07-08 ENCOUNTER — Telehealth: Payer: Self-pay

## 2015-07-08 NOTE — Telephone Encounter (Signed)
Contacted pharmacy- Pharmacist states that a prior auth should allow coverage for it. I will obtain a prior auth.

## 2015-07-08 NOTE — Telephone Encounter (Signed)
Patients daughter contacted office stating that her Laura Lowe is no longer covered by insurance at $440.  She wants to try namzeric as this should be covered by her insurance. Please advise if this is ok.

## 2015-07-08 NOTE — Telephone Encounter (Signed)
Patient notified

## 2015-07-08 NOTE — Telephone Encounter (Signed)
Please call pharmacy to see what would be covered including what she is requesting. However the requested Rx is actually 2 drugs in one Namenda and Aricept

## 2015-07-08 NOTE — Telephone Encounter (Signed)
Dr. Lenord Fellers changed prescription to [facility]  1 cap BID.

## 2015-07-23 ENCOUNTER — Encounter: Payer: Self-pay | Admitting: *Deleted

## 2015-08-07 ENCOUNTER — Other Ambulatory Visit: Payer: Self-pay | Admitting: Internal Medicine

## 2015-08-12 ENCOUNTER — Ambulatory Visit (INDEPENDENT_AMBULATORY_CARE_PROVIDER_SITE_OTHER): Payer: Medicare Other | Admitting: Podiatry

## 2015-08-12 ENCOUNTER — Encounter: Payer: Self-pay | Admitting: Podiatry

## 2015-08-12 DIAGNOSIS — B351 Tinea unguium: Secondary | ICD-10-CM | POA: Diagnosis not present

## 2015-08-12 DIAGNOSIS — M79676 Pain in unspecified toe(s): Secondary | ICD-10-CM

## 2015-08-12 NOTE — Progress Notes (Signed)
Patient ID: Laura Lowe, female   DOB: April 11, 1920, 82 y.o.   MRN: [id number] Complaint:  Visit Type: Patient returns to my office for continued preventative foot care services. Complaint: Patient states" my nails have grown long and thick and become painful to walk and wear shoes". The patient presents for preventative foot care services. No changes to ROS  Podiatric Exam: Vascular: dorsalis pedis and posterior tibial pulses are not  palpable bilateral. Capillary return is immediate. Temperature gradient is WNL. Skin turgor WNL  Sensorium: Normal Semmes Weinstein monofilament test. Normal tactile sensation bilaterally. Nail Exam: Pt has thick disfigured discolored nails with subungual debris noted bilateral entire nail hallux through fifth toenails Ulcer Exam: There is no evidence of ulcer or pre-ulcerative changes or infection. Orthopedic Exam: Muscle tone and strength are WNL. No limitations in general ROM. No crepitus or effusions noted. Foot type and digits show no abnormalities. Bony prominences are unremarkable. Skin: No Porokeratosis. No infection or ulcers  Diagnosis:  Onychomycosis, , Pain in right toe, pain in left toes  Treatment & Plan Procedures and Treatment: Consent by patient was obtained for treatment procedures. The patient understood the discussion of treatment and procedures well. All questions were answered thoroughly reviewed. Debridement of mycotic and hypertrophic toenails, 1 through 5 bilateral and clearing of subungual debris. No ulceration, no infection noted.  Return Visit-Office Procedure: Patient instructed to return to the office for a follow up visit 3 months for continued evaluation and treatment.   Helane GuntherGregory Tkai Large DPM

## 2015-08-26 ENCOUNTER — Telehealth: Payer: Self-pay | Admitting: Internal Medicine

## 2015-08-26 MED ORDER — OLANZAPINE 5 MG PO TABS: 5.0000 mg | ORAL_TABLET | Freq: Two times a day (BID) | ORAL | Status: DC

## 2015-08-26 NOTE — Telephone Encounter (Signed)
Patient's daughter Donna notified.

## 2015-08-26 NOTE — Telephone Encounter (Signed)
Please change  Dose  To 5 mg bid. See note from daughter.

## 2015-08-26 NOTE — Telephone Encounter (Signed)
Daughter, Laura Lowe (who is her Primary Caregive and POA) is calling.  States that her medication, Zyprexa 5mg .  She has become very agitated in the last month.  States that she feels that it's time to increase the medication.  She has been giving it to her 2.5mg  in the a.m. And 2.5 mg in the p.m.  She's to the point where she is being agitated and states that she has even tried to escape.  She's had to start using deadbolts, etc.  She has a day time sitter when she's at work, etc.    She feels she needs to increase the dosage.  She may need either 2.5 in the morning and 5 at night.  Or, 5 twice a day.  She just knows that the Valium just sedates her so much.  She states that her appetite is great and she's very energetic.  She can't seem to allow herself to rest.  She has trouble getting herself to rest.  She has a little edema in her feet (dependent edema), but she just doesn't want to sit down and be still. She paces and is always looking for her coat and pocket book and wanting to go.  She's still continent.  She's pleasant most of the time, and knows the family but doesn't recognize them as her family any longer; just as the "nice brown headed little girl who takes her places."    Please advise if the medication can be increased.  Thank you.

## 2015-09-08 ENCOUNTER — Telehealth: Payer: Self-pay | Admitting: Internal Medicine

## 2015-09-08 MED ORDER — OLANZAPINE 10 MG PO TABS: 10.0000 mg | ORAL_TABLET | Freq: Every day | ORAL | Status: DC

## 2015-09-08 NOTE — Telephone Encounter (Signed)
Daughter called saying that patient was out of her medicine for several days and had become very agitated and crying. Apparently prior authorization needed for Zyprexa 5 mg twice daily which is what daughter wanted to try with patient since she has been more agitated recently. We will try olanzapine 10 mg at bedtime. Daughter may elect to give it twice daily in split doses. [facility] and [facility]

## 2015-10-21 ENCOUNTER — Emergency Department (HOSPITAL_COMMUNITY): Payer: Medicare Other

## 2015-10-21 ENCOUNTER — Inpatient Hospital Stay (HOSPITAL_COMMUNITY)
Admission: EM | Admit: 2015-10-21 | Discharge: 2015-10-25 | DRG: 481 | Disposition: A | Discharge status: Skilled Nursing Facility | Payer: Medicare Other | Attending: Internal Medicine | Admitting: Internal Medicine

## 2015-10-21 ENCOUNTER — Encounter (HOSPITAL_COMMUNITY): Payer: Self-pay | Admitting: Oncology

## 2015-10-21 DIAGNOSIS — S72142A Displaced intertrochanteric fracture of left femur, initial encounter for closed fracture: Secondary | ICD-10-CM | POA: Diagnosis not present

## 2015-10-21 DIAGNOSIS — S72009A Fracture of unspecified part of neck of unspecified femur, initial encounter for closed fracture: Secondary | ICD-10-CM | POA: Diagnosis present

## 2015-10-21 DIAGNOSIS — D72829 Elevated white blood cell count, unspecified: Secondary | ICD-10-CM | POA: Diagnosis present

## 2015-10-21 DIAGNOSIS — D509 Iron deficiency anemia, unspecified: Secondary | ICD-10-CM | POA: Diagnosis present

## 2015-10-21 DIAGNOSIS — D62 Acute posthemorrhagic anemia: Secondary | ICD-10-CM | POA: Diagnosis not present

## 2015-10-21 DIAGNOSIS — Z888 Allergy status to other drugs, medicaments and biological substances status: Secondary | ICD-10-CM

## 2015-10-21 DIAGNOSIS — S0990XA Unspecified injury of head, initial encounter: Secondary | ICD-10-CM | POA: Diagnosis not present

## 2015-10-21 DIAGNOSIS — I10 Essential (primary) hypertension: Secondary | ICD-10-CM | POA: Diagnosis not present

## 2015-10-21 DIAGNOSIS — E039 Hypothyroidism, unspecified: Secondary | ICD-10-CM | POA: Diagnosis not present

## 2015-10-21 DIAGNOSIS — S5002XA Contusion of left elbow, initial encounter: Secondary | ICD-10-CM | POA: Diagnosis not present

## 2015-10-21 DIAGNOSIS — Z419 Encounter for procedure for purposes other than remedying health state, unspecified: Secondary | ICD-10-CM

## 2015-10-21 DIAGNOSIS — Z66 Do not resuscitate: Secondary | ICD-10-CM | POA: Diagnosis present

## 2015-10-21 DIAGNOSIS — R911 Solitary pulmonary nodule: Secondary | ICD-10-CM | POA: Diagnosis present

## 2015-10-21 DIAGNOSIS — M25552 Pain in left hip: Secondary | ICD-10-CM | POA: Diagnosis not present

## 2015-10-21 DIAGNOSIS — F0390 Unspecified dementia without behavioral disturbance: Secondary | ICD-10-CM | POA: Diagnosis present

## 2015-10-21 DIAGNOSIS — H9190 Unspecified hearing loss, unspecified ear: Secondary | ICD-10-CM | POA: Diagnosis present

## 2015-10-21 DIAGNOSIS — W1830XA Fall on same level, unspecified, initial encounter: Secondary | ICD-10-CM | POA: Diagnosis present

## 2015-10-21 DIAGNOSIS — Y92009 Unspecified place in unspecified non-institutional (private) residence as the place of occurrence of the external cause: Secondary | ICD-10-CM

## 2015-10-21 DIAGNOSIS — M810 Age-related osteoporosis without current pathological fracture: Secondary | ICD-10-CM | POA: Diagnosis present

## 2015-10-21 DIAGNOSIS — M7989 Other specified soft tissue disorders: Secondary | ICD-10-CM | POA: Diagnosis not present

## 2015-10-21 DIAGNOSIS — F329 Major depressive disorder, single episode, unspecified: Secondary | ICD-10-CM | POA: Diagnosis present

## 2015-10-21 DIAGNOSIS — Z823 Family history of stroke: Secondary | ICD-10-CM

## 2015-10-21 DIAGNOSIS — Z79899 Other long term (current) drug therapy: Secondary | ICD-10-CM

## 2015-10-21 DIAGNOSIS — S199XXA Unspecified injury of neck, initial encounter: Secondary | ICD-10-CM | POA: Diagnosis not present

## 2015-10-21 DIAGNOSIS — Z791 Long term (current) use of non-steroidal anti-inflammatories (NSAID): Secondary | ICD-10-CM

## 2015-10-21 DIAGNOSIS — Z01818 Encounter for other preprocedural examination: Secondary | ICD-10-CM

## 2015-10-21 DIAGNOSIS — H409 Unspecified glaucoma: Secondary | ICD-10-CM | POA: Diagnosis present

## 2015-10-21 DIAGNOSIS — Z09 Encounter for follow-up examination after completed treatment for conditions other than malignant neoplasm: Secondary | ICD-10-CM

## 2015-10-21 DIAGNOSIS — F419 Anxiety disorder, unspecified: Secondary | ICD-10-CM | POA: Diagnosis present

## 2015-10-21 DIAGNOSIS — S72002A Fracture of unspecified part of neck of left femur, initial encounter for closed fracture: Secondary | ICD-10-CM | POA: Diagnosis present

## 2015-10-21 DIAGNOSIS — Z809 Family history of malignant neoplasm, unspecified: Secondary | ICD-10-CM

## 2015-10-21 DIAGNOSIS — Z8249 Family history of ischemic heart disease and other diseases of the circulatory system: Secondary | ICD-10-CM

## 2015-10-21 DIAGNOSIS — S8992XA Unspecified injury of left lower leg, initial encounter: Secondary | ICD-10-CM | POA: Diagnosis not present

## 2015-10-21 DIAGNOSIS — Z96652 Presence of left artificial knee joint: Secondary | ICD-10-CM | POA: Diagnosis present

## 2015-10-21 DIAGNOSIS — W19XXXA Unspecified fall, initial encounter: Secondary | ICD-10-CM

## 2015-10-21 HISTORY — DX: Unspecified dementia, unspecified severity, without behavioral disturbance, psychotic disturbance, mood disturbance, and anxiety: F03.90

## 2015-10-21 NOTE — ED Notes (Addendum)
Per pt's daughters who are her care givers, pt has severe dementia.  Tonight pt was insisting on going outside to look for her baby.  Pt's daughter states that there are four brick stairs that they believe pt fell down.  Pt was found on her left side.  Unknown if pt hit her head however daughters state that they don't believe she did.  Pt cannot bear weight on her left leg.  Pt points to her left knee and above when asked asked about pain.  Pt given Zyprexa 5 mg PTA. Once pt in bed, obvious shortening and rotation of left leg.  Bruising and swelling noted to left elbow and left forearm.

## 2015-10-22 ENCOUNTER — Encounter (HOSPITAL_COMMUNITY)
Admission: EM | Disposition: A | Discharge status: Skilled Nursing Facility | Payer: Self-pay | Source: Home / Self Care | Attending: Internal Medicine

## 2015-10-22 ENCOUNTER — Inpatient Hospital Stay (HOSPITAL_COMMUNITY): Payer: Medicare Other | Admitting: Certified Registered Nurse Anesthetist

## 2015-10-22 ENCOUNTER — Emergency Department (HOSPITAL_COMMUNITY): Payer: Medicare Other

## 2015-10-22 ENCOUNTER — Inpatient Hospital Stay (HOSPITAL_COMMUNITY): Payer: Medicare Other

## 2015-10-22 ENCOUNTER — Encounter (HOSPITAL_COMMUNITY): Payer: Self-pay | Admitting: Internal Medicine

## 2015-10-22 DIAGNOSIS — S5002XA Contusion of left elbow, initial encounter: Secondary | ICD-10-CM | POA: Diagnosis not present

## 2015-10-22 DIAGNOSIS — Z888 Allergy status to other drugs, medicaments and biological substances status: Secondary | ICD-10-CM | POA: Diagnosis not present

## 2015-10-22 DIAGNOSIS — S199XXA Unspecified injury of neck, initial encounter: Secondary | ICD-10-CM | POA: Diagnosis not present

## 2015-10-22 DIAGNOSIS — D62 Acute posthemorrhagic anemia: Secondary | ICD-10-CM | POA: Diagnosis not present

## 2015-10-22 DIAGNOSIS — H9190 Unspecified hearing loss, unspecified ear: Secondary | ICD-10-CM | POA: Diagnosis present

## 2015-10-22 DIAGNOSIS — R278 Other lack of coordination: Secondary | ICD-10-CM | POA: Diagnosis not present

## 2015-10-22 DIAGNOSIS — Z5189 Encounter for other specified aftercare: Secondary | ICD-10-CM | POA: Diagnosis not present

## 2015-10-22 DIAGNOSIS — Z4789 Encounter for other orthopedic aftercare: Secondary | ICD-10-CM | POA: Diagnosis not present

## 2015-10-22 DIAGNOSIS — S72002A Fracture of unspecified part of neck of left femur, initial encounter for closed fracture: Secondary | ICD-10-CM | POA: Diagnosis not present

## 2015-10-22 DIAGNOSIS — Z01818 Encounter for other preprocedural examination: Secondary | ICD-10-CM | POA: Diagnosis not present

## 2015-10-22 DIAGNOSIS — E039 Hypothyroidism, unspecified: Secondary | ICD-10-CM | POA: Diagnosis not present

## 2015-10-22 DIAGNOSIS — F0390 Unspecified dementia without behavioral disturbance: Secondary | ICD-10-CM | POA: Diagnosis not present

## 2015-10-22 DIAGNOSIS — M25552 Pain in left hip: Secondary | ICD-10-CM | POA: Diagnosis not present

## 2015-10-22 DIAGNOSIS — M1990 Unspecified osteoarthritis, unspecified site: Secondary | ICD-10-CM | POA: Diagnosis not present

## 2015-10-22 DIAGNOSIS — Z823 Family history of stroke: Secondary | ICD-10-CM | POA: Diagnosis not present

## 2015-10-22 DIAGNOSIS — S72009A Fracture of unspecified part of neck of unspecified femur, initial encounter for closed fracture: Secondary | ICD-10-CM | POA: Diagnosis not present

## 2015-10-22 DIAGNOSIS — M7989 Other specified soft tissue disorders: Secondary | ICD-10-CM | POA: Diagnosis not present

## 2015-10-22 DIAGNOSIS — R41841 Cognitive communication deficit: Secondary | ICD-10-CM | POA: Diagnosis not present

## 2015-10-22 DIAGNOSIS — S8992XA Unspecified injury of left lower leg, initial encounter: Secondary | ICD-10-CM | POA: Diagnosis not present

## 2015-10-22 DIAGNOSIS — Z79899 Other long term (current) drug therapy: Secondary | ICD-10-CM | POA: Diagnosis not present

## 2015-10-22 DIAGNOSIS — F419 Anxiety disorder, unspecified: Secondary | ICD-10-CM | POA: Diagnosis present

## 2015-10-22 DIAGNOSIS — F329 Major depressive disorder, single episode, unspecified: Secondary | ICD-10-CM | POA: Diagnosis present

## 2015-10-22 DIAGNOSIS — D72829 Elevated white blood cell count, unspecified: Secondary | ICD-10-CM | POA: Diagnosis present

## 2015-10-22 DIAGNOSIS — H409 Unspecified glaucoma: Secondary | ICD-10-CM | POA: Diagnosis present

## 2015-10-22 DIAGNOSIS — I10 Essential (primary) hypertension: Secondary | ICD-10-CM | POA: Diagnosis not present

## 2015-10-22 DIAGNOSIS — S0990XA Unspecified injury of head, initial encounter: Secondary | ICD-10-CM | POA: Diagnosis not present

## 2015-10-22 DIAGNOSIS — S72002D Fracture of unspecified part of neck of left femur, subsequent encounter for closed fracture with routine healing: Secondary | ICD-10-CM | POA: Diagnosis not present

## 2015-10-22 DIAGNOSIS — D509 Iron deficiency anemia, unspecified: Secondary | ICD-10-CM | POA: Diagnosis present

## 2015-10-22 DIAGNOSIS — Z66 Do not resuscitate: Secondary | ICD-10-CM | POA: Diagnosis present

## 2015-10-22 DIAGNOSIS — R911 Solitary pulmonary nodule: Secondary | ICD-10-CM | POA: Diagnosis present

## 2015-10-22 DIAGNOSIS — W1830XA Fall on same level, unspecified, initial encounter: Secondary | ICD-10-CM | POA: Diagnosis present

## 2015-10-22 DIAGNOSIS — Z96652 Presence of left artificial knee joint: Secondary | ICD-10-CM | POA: Diagnosis present

## 2015-10-22 DIAGNOSIS — G309 Alzheimer's disease, unspecified: Secondary | ICD-10-CM | POA: Diagnosis not present

## 2015-10-22 DIAGNOSIS — Z791 Long term (current) use of non-steroidal anti-inflammatories (NSAID): Secondary | ICD-10-CM | POA: Diagnosis not present

## 2015-10-22 DIAGNOSIS — M25562 Pain in left knee: Secondary | ICD-10-CM | POA: Diagnosis not present

## 2015-10-22 DIAGNOSIS — R0989 Other specified symptoms and signs involving the circulatory and respiratory systems: Secondary | ICD-10-CM | POA: Diagnosis not present

## 2015-10-22 DIAGNOSIS — R29898 Other symptoms and signs involving the musculoskeletal system: Secondary | ICD-10-CM | POA: Diagnosis not present

## 2015-10-22 DIAGNOSIS — Z809 Family history of malignant neoplasm, unspecified: Secondary | ICD-10-CM | POA: Diagnosis not present

## 2015-10-22 DIAGNOSIS — F0280 Dementia in other diseases classified elsewhere without behavioral disturbance: Secondary | ICD-10-CM | POA: Diagnosis not present

## 2015-10-22 DIAGNOSIS — S72142A Displaced intertrochanteric fracture of left femur, initial encounter for closed fracture: Secondary | ICD-10-CM | POA: Diagnosis not present

## 2015-10-22 DIAGNOSIS — Z8249 Family history of ischemic heart disease and other diseases of the circulatory system: Secondary | ICD-10-CM | POA: Diagnosis not present

## 2015-10-22 DIAGNOSIS — S72142D Displaced intertrochanteric fracture of left femur, subsequent encounter for closed fracture with routine healing: Secondary | ICD-10-CM | POA: Diagnosis not present

## 2015-10-22 DIAGNOSIS — M810 Age-related osteoporosis without current pathological fracture: Secondary | ICD-10-CM | POA: Diagnosis present

## 2015-10-22 DIAGNOSIS — Y92009 Unspecified place in unspecified non-institutional (private) residence as the place of occurrence of the external cause: Secondary | ICD-10-CM | POA: Diagnosis not present

## 2015-10-22 DIAGNOSIS — M6281 Muscle weakness (generalized): Secondary | ICD-10-CM | POA: Diagnosis not present

## 2015-10-22 HISTORY — PX: FEMUR IM NAIL: SHX1597

## 2015-10-22 LAB — BASIC METABOLIC PANEL
ANION GAP: 6 (ref 5–15)
BUN: 27 mg/dL — ABNORMAL HIGH (ref 6–20)
CHLORIDE: 110 mmol/L (ref 101–111)
CO2: 25 mmol/L (ref 22–32)
CREATININE: 0.92 mg/dL (ref 0.44–1.00)
Calcium: 8.7 mg/dL — ABNORMAL LOW (ref 8.9–10.3)
GFR calc non Af Amer: 52 mL/min — ABNORMAL LOW (ref >60)
Glucose, Bld: 127 mg/dL — ABNORMAL HIGH (ref 65–99)
POTASSIUM: 4.2 mmol/L (ref 3.5–5.1)
SODIUM: 141 mmol/L (ref 135–145)

## 2015-10-22 LAB — COMPREHENSIVE METABOLIC PANEL
ALBUMIN: 3.5 g/dL (ref 3.5–5.0)
ALT: 12 U/L — ABNORMAL LOW (ref 14–54)
ANION GAP: 7 (ref 5–15)
AST: 20 U/L (ref 15–41)
Alkaline Phosphatase: 57 U/L (ref 38–126)
BILIRUBIN TOTAL: 0.6 mg/dL (ref 0.3–1.2)
BUN: 25 mg/dL — AB (ref 6–20)
CHLORIDE: 109 mmol/L (ref 101–111)
CO2: 26 mmol/L (ref 22–32)
Calcium: 8.9 mg/dL (ref 8.9–10.3)
Creatinine, Ser: 0.82 mg/dL (ref 0.44–1.00)
GFR calc Af Amer: >60 mL/min (ref >60)
GFR, EST NON AFRICAN AMERICAN: 60 mL/min — AB (ref >60)
GLUCOSE: 125 mg/dL — AB (ref 65–99)
POTASSIUM: 4.1 mmol/L (ref 3.5–5.1)
Sodium: 142 mmol/L (ref 135–145)
TOTAL PROTEIN: 5.9 g/dL — AB (ref 6.5–8.1)

## 2015-10-22 LAB — GLUCOSE, CAPILLARY
GLUCOSE-CAPILLARY: 98 mg/dL (ref 65–99)
GLUCOSE-CAPILLARY: 85 mg/dL (ref 65–99)
Glucose-Capillary: 108 mg/dL — ABNORMAL HIGH (ref 65–99)

## 2015-10-22 LAB — CBC WITH DIFFERENTIAL/PLATELET
BASOS ABS: 0 10*3/uL (ref 0.0–0.1)
BASOS PCT: 0 %
Basophils Absolute: 0 10*3/uL (ref 0.0–0.1)
Basophils Relative: 0 %
EOS ABS: 0.1 10*3/uL (ref 0.0–0.7)
EOS PCT: 1 %
Eosinophils Absolute: 0.1 10*3/uL (ref 0.0–0.7)
Eosinophils Relative: 1 %
HCT: 32.6 % — ABNORMAL LOW (ref 36.0–46.0)
HEMATOCRIT: 34 % — AB (ref 36.0–46.0)
HEMOGLOBIN: 10.7 g/dL — AB (ref 12.0–15.0)
Hemoglobin: 10.2 g/dL — ABNORMAL LOW (ref 12.0–15.0)
LYMPHS ABS: 1.2 10*3/uL (ref 0.7–4.0)
Lymphocytes Relative: 16 %
Lymphocytes Relative: 8 %
Lymphs Abs: 2.2 10*3/uL (ref 0.7–4.0)
MCH: 28.4 pg (ref 26.0–34.0)
MCH: 29.5 pg (ref 26.0–34.0)
MCHC: 31.3 g/dL (ref 30.0–36.0)
MCHC: 31.5 g/dL (ref 30.0–36.0)
MCV: 90.8 fL (ref 78.0–100.0)
MCV: 93.7 fL (ref 78.0–100.0)
MONO ABS: 1.4 10*3/uL — AB (ref 0.1–1.0)
MONOS PCT: 10 %
MONOS PCT: 7 %
Monocytes Absolute: 1 10*3/uL (ref 0.1–1.0)
NEUTROS ABS: 12.9 10*3/uL — AB (ref 1.7–7.7)
NEUTROS PCT: 84 %
Neutro Abs: 10 10*3/uL — ABNORMAL HIGH (ref 1.7–7.7)
Neutrophils Relative %: 73 %
PLATELETS: 227 10*3/uL (ref 150–400)
Platelets: 219 10*3/uL (ref 150–400)
RBC: 3.59 MIL/uL — ABNORMAL LOW (ref 3.87–5.11)
RBC: 3.63 MIL/uL — AB (ref 3.87–5.11)
RDW: 13.2 % (ref 11.5–15.5)
RDW: 13.5 % (ref 11.5–15.5)
WBC: 13.6 10*3/uL — ABNORMAL HIGH (ref 4.0–10.5)
WBC: 15.2 10*3/uL — AB (ref 4.0–10.5)

## 2015-10-22 LAB — URINALYSIS, ROUTINE W REFLEX MICROSCOPIC
Bilirubin Urine: NEGATIVE
Glucose, UA: NEGATIVE mg/dL
Hgb urine dipstick: NEGATIVE
KETONES UR: NEGATIVE mg/dL
LEUKOCYTES UA: NEGATIVE
NITRITE: NEGATIVE
PROTEIN: NEGATIVE mg/dL
Specific Gravity, Urine: 1.024 (ref 1.005–1.030)
pH: 6 (ref 5.0–8.0)

## 2015-10-22 LAB — PROTIME-INR
INR: 1.01 (ref 0.00–1.49)
PROTHROMBIN TIME: 13.5 s (ref 11.6–15.2)

## 2015-10-22 LAB — SURGICAL PCR SCREEN
MRSA, PCR: NEGATIVE
STAPHYLOCOCCUS AUREUS: NEGATIVE

## 2015-10-22 SURGERY — INSERTION, INTRAMEDULLARY ROD, FEMUR
Anesthesia: Spinal | Laterality: Left

## 2015-10-22 MED ORDER — PROPOFOL 500 MG/50ML IV EMUL
INTRAVENOUS | Status: DC | PRN
  Administered 2015-10-22: 75 ug/kg/min via INTRAVENOUS

## 2015-10-22 MED ORDER — OXYCODONE HCL 5 MG/5ML PO SOLN
5.0000 mg | Freq: Once | ORAL | Status: DC | PRN
  Filled 2015-10-22: qty 5

## 2015-10-22 MED ORDER — BUPIVACAINE-EPINEPHRINE 0.25% -1:200000 IJ SOLN
INTRAMUSCULAR | Status: AC
  Filled 2015-10-22: qty 1

## 2015-10-22 MED ORDER — ACETAMINOPHEN 160 MG/5ML PO SOLN: 325.0000 mg | ORAL | Status: DC | PRN

## 2015-10-22 MED ORDER — SODIUM CHLORIDE 0.9 % IV SOLN
INTRAVENOUS | Status: DC
  Administered 2015-10-22 – 2015-10-23 (×2): via INTRAVENOUS

## 2015-10-22 MED ORDER — KETOROLAC TROMETHAMINE 30 MG/ML IJ SOLN
INTRAMUSCULAR | Status: AC
  Filled 2015-10-22: qty 1

## 2015-10-22 MED ORDER — PROPOFOL 10 MG/ML IV BOLUS
INTRAVENOUS | Status: AC
  Filled 2015-10-22: qty 20

## 2015-10-22 MED ORDER — OXYCODONE HCL 5 MG PO TABS: 5.0000 mg | ORAL_TABLET | Freq: Once | ORAL | Status: DC | PRN

## 2015-10-22 MED ORDER — PHENYLEPHRINE HCL 10 MG/ML IJ SOLN
INTRAMUSCULAR | Status: DC | PRN
  Administered 2015-10-22 (×2): 80 ug via INTRAVENOUS

## 2015-10-22 MED ORDER — ACETAMINOPHEN 325 MG PO TABS: 325.0000 mg | ORAL_TABLET | ORAL | Status: DC | PRN

## 2015-10-22 MED ORDER — EPHEDRINE SULFATE 50 MG/ML IJ SOLN
INTRAMUSCULAR | Status: DC | PRN
  Administered 2015-10-22 (×2): 10 mg via INTRAVENOUS

## 2015-10-22 MED ORDER — LATANOPROST 0.005 % OP SOLN
1.0000 [drp] | Freq: Every day | OPHTHALMIC | Status: DC
  Administered 2015-10-23 – 2015-10-24 (×2): 1 [drp] via OPHTHALMIC
  Filled 2015-10-22: qty 2.5

## 2015-10-22 MED ORDER — FENTANYL CITRATE (PF) 100 MCG/2ML IJ SOLN: 25.0000 ug | INTRAMUSCULAR | Status: DC | PRN

## 2015-10-22 MED ORDER — PROPOFOL 10 MG/ML IV BOLUS
INTRAVENOUS | Status: AC
  Filled 2015-10-22: qty 40

## 2015-10-22 MED ORDER — ISOPROPYL ALCOHOL 70 % SOLN
Status: DC | PRN
  Administered 2015-10-22: 1 via TOPICAL

## 2015-10-22 MED ORDER — SODIUM CHLORIDE 0.9 % IJ SOLN
INTRAMUSCULAR | Status: AC
  Filled 2015-10-22: qty 50

## 2015-10-22 MED ORDER — MORPHINE SULFATE (PF) 2 MG/ML IV SOLN
0.5000 mg | INTRAVENOUS | Status: DC | PRN
  Administered 2015-10-22 – 2015-10-23 (×8): 0.5 mg via INTRAVENOUS
  Filled 2015-10-22 (×8): qty 1

## 2015-10-22 MED ORDER — PROPOFOL 500 MG/50ML IV EMUL
INTRAVENOUS | Status: DC | PRN
  Administered 2015-10-22: 40 mg via INTRAVENOUS
  Administered 2015-10-22: 30 mg via INTRAVENOUS

## 2015-10-22 MED ORDER — FENTANYL CITRATE (PF) 100 MCG/2ML IJ SOLN
INTRAMUSCULAR | Status: AC
  Filled 2015-10-22: qty 2

## 2015-10-22 MED ORDER — ALBUTEROL SULFATE (2.5 MG/3ML) 0.083% IN NEBU: 2.5000 mg | INHALATION_SOLUTION | Freq: Four times a day (QID) | RESPIRATORY_TRACT | Status: DC | PRN

## 2015-10-22 MED ORDER — BUPIVACAINE HCL (PF) 0.5 % IJ SOLN
INTRAMUSCULAR | Status: AC
  Filled 2015-10-22: qty 30

## 2015-10-22 MED ORDER — RAMIPRIL 10 MG PO CAPS
10.0000 mg | ORAL_CAPSULE | Freq: Every day | ORAL | Status: DC
  Administered 2015-10-23 – 2015-10-25 (×3): 10 mg via ORAL
  Filled 2015-10-22 (×4): qty 1

## 2015-10-22 MED ORDER — FLUTICASONE PROPIONATE 50 MCG/ACT NA SUSP
2.0000 | Freq: Every day | NASAL | Status: DC | PRN
  Filled 2015-10-22: qty 16

## 2015-10-22 MED ORDER — PAROXETINE HCL 10 MG PO TABS
10.0000 mg | ORAL_TABLET | Freq: Every day | ORAL | Status: DC
  Administered 2015-10-23 – 2015-10-25 (×3): 10 mg via ORAL
  Filled 2015-10-22 (×4): qty 1

## 2015-10-22 MED ORDER — PHENYLEPHRINE HCL 10 MG/ML IJ SOLN
10.0000 mg | INTRAVENOUS | Status: DC | PRN
  Administered 2015-10-22: 30 ug/min via INTRAVENOUS

## 2015-10-22 MED ORDER — OLANZAPINE 5 MG PO TABS
5.0000 mg | ORAL_TABLET | Freq: Two times a day (BID) | ORAL | Status: DC
  Administered 2015-10-22 – 2015-10-25 (×6): 5 mg via ORAL
  Filled 2015-10-22 (×8): qty 1

## 2015-10-22 MED ORDER — LEVOTHYROXINE SODIUM 50 MCG PO TABS
50.0000 ug | ORAL_TABLET | Freq: Every day | ORAL | Status: DC
  Administered 2015-10-23 – 2015-10-25 (×3): 50 ug via ORAL
  Filled 2015-10-22 (×3): qty 1

## 2015-10-22 MED ORDER — FERROUS SULFATE 325 (65 FE) MG PO TABS
325.0000 mg | ORAL_TABLET | Freq: Every day | ORAL | Status: DC
  Administered 2015-10-23 – 2015-10-25 (×3): 325 mg via ORAL
  Filled 2015-10-22 (×3): qty 1

## 2015-10-22 MED ORDER — CEFAZOLIN SODIUM-DEXTROSE 2-4 GM/100ML-% IV SOLN
INTRAVENOUS | Status: AC
  Filled 2015-10-22: qty 100

## 2015-10-22 MED ORDER — CHLORHEXIDINE GLUCONATE 4 % EX LIQD: 60.0000 mL | Freq: Once | CUTANEOUS | Status: DC

## 2015-10-22 MED ORDER — DIAZEPAM 5 MG PO TABS
5.0000 mg | ORAL_TABLET | Freq: Two times a day (BID) | ORAL | Status: DC | PRN
  Administered 2015-10-22 – 2015-10-23 (×2): 5 mg via ORAL
  Filled 2015-10-22 (×3): qty 1

## 2015-10-22 MED ORDER — BUPIVACAINE HCL (PF) 0.5 % IJ SOLN
INTRAMUSCULAR | Status: DC | PRN
  Administered 2015-10-22: 2.6 mL via INTRATHECAL

## 2015-10-22 MED ORDER — ALBUTEROL SULFATE HFA 108 (90 BASE) MCG/ACT IN AERS: 2.0000 | INHALATION_SPRAY | Freq: Four times a day (QID) | RESPIRATORY_TRACT | Status: DC | PRN

## 2015-10-22 MED ORDER — CEFAZOLIN SODIUM-DEXTROSE 2-4 GM/100ML-% IV SOLN
2.0000 g | INTRAVENOUS | Status: AC
  Administered 2015-10-22: 2 g via INTRAVENOUS
  Filled 2015-10-22: qty 100

## 2015-10-22 MED ORDER — PHENYLEPHRINE HCL 10 MG/ML IJ SOLN
INTRAMUSCULAR | Status: AC
  Filled 2015-10-22: qty 1

## 2015-10-22 MED ORDER — HYDRALAZINE HCL 20 MG/ML IJ SOLN: 10.0000 mg | INTRAMUSCULAR | Status: DC | PRN

## 2015-10-22 MED ORDER — POVIDONE-IODINE 10 % EX SWAB: 2.0000 "application " | Freq: Once | CUTANEOUS | Status: DC

## 2015-10-22 MED ORDER — LACTATED RINGERS IV SOLN
INTRAVENOUS | Status: DC
  Administered 2015-10-22 (×2): via INTRAVENOUS

## 2015-10-22 MED ORDER — HYDROCODONE-ACETAMINOPHEN 5-325 MG PO TABS: 1.0000 | ORAL_TABLET | Freq: Four times a day (QID) | ORAL | Status: DC | PRN

## 2015-10-22 MED ORDER — MEMANTINE HCL 10 MG PO TABS
10.0000 mg | ORAL_TABLET | Freq: Two times a day (BID) | ORAL | Status: DC
  Administered 2015-10-23 – 2015-10-25 (×5): 10 mg via ORAL
  Filled 2015-10-22 (×8): qty 1

## 2015-10-22 MED ORDER — ONDANSETRON HCL 4 MG/2ML IJ SOLN
INTRAMUSCULAR | Status: DC | PRN
  Administered 2015-10-22: 4 mg via INTRAVENOUS

## 2015-10-22 SURGICAL SUPPLY — 36 items
BAG SPEC THK2 15X12 ZIP CLS (MISCELLANEOUS)
BAG ZIPLOCK 12X15 (MISCELLANEOUS) IMPLANT
BIT DRILL 4.2 (DRILL) IMPLANT
CHLORAPREP W/TINT 26ML (MISCELLANEOUS) ×2 IMPLANT
COVER PERINEAL POST (MISCELLANEOUS) ×2 IMPLANT
DRAPE C-ARM 42X120 X-RAY (DRAPES) ×2 IMPLANT
DRAPE C-ARMOR (DRAPES) ×2 IMPLANT
DRAPE ORTHO SPLIT 77X108 STRL (DRAPES)
DRAPE STERI IOBAN 125X83 (DRAPES) ×2 IMPLANT
DRAPE SURG ORHT 6 SPLT 77X108 (DRAPES) IMPLANT
DRAPE U-SHAPE 47X51 STRL (DRAPES) ×4 IMPLANT
DRILL 4.2 (DRILL) ×2
DRSG AQUACEL AG ADV 3.5X10 (GAUZE/BANDAGES/DRESSINGS) ×1 IMPLANT
DRSG MEPILEX BORDER 4X4 (GAUZE/BANDAGES/DRESSINGS) ×6 IMPLANT
ELECT BLADE TIP CTD 4 INCH (ELECTRODE) ×2 IMPLANT
GAUZE SPONGE 4X4 12PLY STRL (GAUZE/BANDAGES/DRESSINGS) ×2 IMPLANT
GLOVE BIO SURGEON STRL SZ8.5 (GLOVE) ×4 IMPLANT
GLOVE BIOGEL PI IND STRL 8.5 (GLOVE) ×1 IMPLANT
GLOVE BIOGEL PI INDICATOR 8.5 (GLOVE) ×1
GOWN SPEC L3 XXLG W/TWL (GOWN DISPOSABLE) ×4 IMPLANT
GUIDEWIRE 3.2X400 (WIRE) ×1 IMPLANT
KIT BASIN OR (CUSTOM PROCEDURE TRAY) ×2 IMPLANT
LIQUID BAND (GAUZE/BANDAGES/DRESSINGS) ×3 IMPLANT
MANIFOLD NEPTUNE II (INSTRUMENTS) ×2 IMPLANT
MARKER SKIN DUAL TIP RULER LAB (MISCELLANEOUS) ×2 IMPLANT
NAIL 10/130 TI CAN TFNA 360HIP (Orthopedic Implant) ×1 IMPLANT
PACK TOTAL JOINT (CUSTOM PROCEDURE TRAY) ×2 IMPLANT
REAMER ROD DEEP FLUTE 2.5X950 (INSTRUMENTS) ×1 IMPLANT
SCREW LOCKING 5.0X32MM (Screw) ×1 IMPLANT
SCREW TFNA P5MM STERILE (Screw) ×1 IMPLANT
SUT MNCRL AB 3-0 PS2 18 (SUTURE) ×4 IMPLANT
SUT VIC AB 1 CT1 27 (SUTURE) ×4
SUT VIC AB 1 CT1 27XBRD ANTBC (SUTURE) ×2 IMPLANT
SUT VIC AB 2-0 CT1 27 (SUTURE) ×4
SUT VIC AB 2-0 CT1 27XBRD (SUTURE) ×2 IMPLANT
YANKAUER SUCT BULB TIP NO VENT (SUCTIONS) ×2 IMPLANT

## 2015-10-22 NOTE — Discharge Instructions (Signed)
°Dr. Jalisa Sacco °Adult Hip & Knee Specialist °[facility] °[address] °[place], [place] [address] °[phone] ° ° °POSTOPERATIVE DIRECTIONS ° ° ° °Hip Rehabilitation, Guidelines Following Surgery  ° °WEIGHT BEARING °Partial weight bearing with assist device as directed.  50% with walker ° ° °HOME CARE INSTRUCTIONS  °Remove items at home which could result in a fall. This includes throw rugs or furniture in walking pathways.  °Continue medications as instructed at time of discharge. °· You may have some home medications which will be placed on hold until you complete the course of blood thinner medication. °· 4 days after discharge, you may start showering. No tub baths or soaking your incisions. °Do not put on socks or shoes without following the instructions of your caregivers.   °Sit on chairs with arms. Use the chair arms to help push yourself up when arising.  °Arrange for the use of a toilet seat elevator so you are not sitting low.  °· Walk with walker as instructed.  °You may resume a sexual relationship in one month or when given the OK by your caregiver.  °Use walker as long as suggested by your caregivers.  °Avoid periods of inactivity such as sitting longer than an hour when not asleep. This helps prevent blood clots.  °You may return to work once you are cleared by your surgeon.  °Do not drive a car for 6 weeks or until released by your surgeon.  °Do not drive while taking narcotics.  °Wear elastic stockings for two weeks following surgery during the day but you may remove then at night.  °Make sure you keep all of your appointments after your operation with all of your doctors and caregivers. You should call the office at the above phone number and make an appointment for approximately two weeks after the date of your surgery. °Please pick up a stool softener and laxative for home use as long as you are requiring pain medications. °· ICE to the affected hip every three  hours for 30 minutes at a time and then as needed for pain and swelling. Continue to use ice on the hip for pain and swelling from surgery. You may notice swelling that will progress down to the foot and ankle.  This is normal after surgery.  Elevate the leg when you are not up walking on it.   °It is important for you to complete the blood thinner medication as prescribed by your doctor. °· Continue to use the breathing machine which will help keep your temperature down.  It is common for your temperature to cycle up and down following surgery, especially at night when you are not up moving around and exerting yourself.  The breathing machine keeps your lungs expanded and your temperature down. ° °RANGE OF MOTION AND STRENGTHENING EXERCISES  °These exercises are designed to help you keep full movement of your hip joint. Follow your caregiver's or physical therapist's instructions. Perform all exercises about fifteen times, three times per day or as directed. Exercise both hips, even if you have had only one joint replacement. These exercises can be done on a training (exercise) mat, on the floor, on a table or on a bed. Use whatever works the best and is most comfortable for you. Use music or television while you are exercising so that the exercises are a pleasant break in your day. This will make your life better with the exercises acting as a break in routine you can look   forward to.  °Lying on your back, slowly slide your foot toward your buttocks, raising your knee up off the floor. Then slowly slide your foot back down until your leg is straight again.  °Lying on your back spread your legs as far apart as you can without causing discomfort.  °Lying on your side, raise your upper leg and foot straight up from the floor as far as is comfortable. Slowly lower the leg and repeat.  °Lying on your back, tighten up the muscle in the front of your thigh (quadriceps muscles). You can do this by keeping your leg  straight and trying to raise your heel off the floor. This helps strengthen the largest muscle supporting your knee.  °Lying on your back, tighten up the muscles of your buttocks both with the legs straight and with the knee bent at a comfortable angle while keeping your heel on the floor.  ° °SKILLED REHAB INSTRUCTIONS: °If the patient is transferred to a skilled rehab facility following release from the hospital, a list of the current medications will be sent to the facility for the patient to continue.  When discharged from the skilled rehab facility, please have the facility set up the patient's Home Health Physical Therapy prior to being released. Also, the skilled facility will be responsible for providing the patient with their medications at time of release from the facility to include their pain medication and their blood thinner medication. If the patient is still at the rehab facility at time of the two week follow up appointment, the skilled rehab facility will also need to assist the patient in arranging follow up appointment in our office and any transportation needs. ° °MAKE SURE YOU:  °Understand these instructions.  °Will watch your condition.  °Will get help right away if you are not doing well or get worse. ° °Pick up stool softner and laxative for home use following surgery while on pain medications. °Daily dry dressing changes as needed. °In 4 days, you may remove your dressings and begin taking showers - no tub baths or soaking the incisions. °Continue to use ice for pain and swelling after surgery. °Do not use any lotions or creams on the incision until instructed by your surgeon. ° ° °

## 2015-10-22 NOTE — Progress Notes (Signed)
Initial Nutrition Assessment  DOCUMENTATION CODES:   Not applicable  INTERVENTION:  -Magic cup TID with meals, each supplement provides 290 kcal and 9 grams of protein -RD To continue to monitor   NUTRITION DIAGNOSIS:   Increased nutrient needs related to wound healing as evidenced by estimated needs.  GOAL:   Patient will meet greater than or equal to 90% of their needs  MONITOR:   PO intake, Supplement acceptance, Labs, I & O's, Skin  REASON FOR ASSESSMENT:   Consult Hip fracture protocol  ASSESSMENT:   Laura Lowe is a 82 y.o. female with medical history significant of dementia, hypertension, hypothyroidism was brought to the ER after patient had a fall at her house. Patient had a fall while trying to walk and the daughter at the house did not witness the fall.  Spoke with Ms. [name], multiple daughters at bedside. One of her daughters is a home health nurse who provided history. At home, pt was eating well she states. She wasn't eating much for a while, then found out she was anemic from PCP and was down to 110#.   Has gained approximately 10# after beginning iron supplementation.States pt eats lots of sweets, anything she wants. No issues chewing. She did endorse some concerns with patient swallowing her pills, but otherwise is ok. Per chart, she exhibited a 23#/16% severe wt loss in 6 months from 10/2014 - 04/2015, but she has been stable since.  Nutrition-Focused physical exam completed. Findings are mild fat depletion, mild muscle depletion, and no edema.   Pt looks really good to be 82 yo Daughter iterates pt ambulates regularly, performs most ADLs prior to this fall.  Labs and medications reviewed: FeSO4 325 mg daily;  Diet Order:  Diet NPO time specified Except for: Sips with Meds  Skin:  Reviewed, no issues  Last BM:  6/8  Height:   Ht Readings from Last 1 Encounters:  10/22/15 4\' 7"  (1.397 m)    Weight:   Wt Readings from Last 1  Encounters:  10/22/15 120 lb (54.432 kg)    Ideal Body Weight:  34.09 kg  BMI:  Body mass index is 27.89 kg/(m^2).  Estimated Nutritional Needs:   Kcal:  1100-1360 calories  Protein:  55-70 grams  Fluid:  >/= 1.1L  EDUCATION NEEDS:   No education needs identified at this time  Laura AnoWilliam M. Kerstie Agent, MS, RD LDN Inpatient Clinical Dietitian Pager [phone]

## 2015-10-22 NOTE — H&P (Addendum)
History and Physical    Laura Lowe WUJ:[id number] DOB: 07-16-1919 DOA: 10/21/2015  PCP: [name], MD  Patient coming from: Home.  History obtained from patient's daughter as patient has dementia.  Chief Complaint: Fall.  HPI: Laura Lowe is a 82 y.o. female with medical history significant of dementia, hypertension, hypothyroidism was brought to the ER after patient had a fall at her house. Patient had a fall while trying to walk and the daughter at the house did not witness the fall. In the ER x-rays reveal left hip fracture and on-call orthopedic surgeon Dr. Ranell Patrick has been consulted. Patient denies any chest pain or shortness of breath. CT of the head and C-spine were unremarkable except for lung nodule. On exam patient is not in distress. As per the daughter patient did not lose consciousness and was wandering in the house and suddenly fell.  ED Course: Orthopedic surgeon was consulted.  Review of Systems: As per HPI, rest all negative.   Past Medical History  Diagnosis Date  . Glaucoma   . Arthritis   . Hypertension   . Osteoporosis   . Allergy   . Counseling for estrogen replacement therapy   . IBS (irritable bowel syndrome)   . Anxiety   . Depression   . HSV-1 (herpes simplex virus 1) infection   . Carotid bruit   . Hearing loss   . Vitamin D deficiency   . Dementia     Past Surgical History  Procedure Laterality Date  . Abdominal hysterectomy    . Rotator cuff repair    . Eye surgery      cataract OD & OS  . Joint replacement Left     Knee     reports that she has never smoked. She has never used smokeless tobacco. She reports that she does not drink alcohol or use illicit drugs.  Allergies  Allergen Reactions  . Risedronate Sodium Other (See Comments)    GI    Family History  Problem Relation Age of Onset  . Cancer Mother   . Heart disease Mother   . Hypertension Mother   . Stroke Father     Prior to Admission medications     Medication Sig Start Date End Date Taking? Authorizing Provider  albuterol (PROVENTIL HFA;VENTOLIN HFA) 108 (90 BASE) MCG/ACT inhaler Inhale 2 puffs into the lungs every 6 (six) hours as needed for wheezing or shortness of breath. 10/23/14  Yes [name], MD  diazepam (VALIUM) 5 MG tablet Take 1 tablet (5 mg total) by mouth 2 (two) times daily as needed. for anxiety 05/07/15  Yes [name], MD  Ferrous Sulfate 250 MG CPCR Take 250 mg by mouth daily.    Yes Historical Provider, MD  fluticasone (FLONASE) 50 MCG/ACT nasal spray Place 2 sprays into the nose daily as needed for allergies.    Yes Historical Provider, MD  levothyroxine (SYNTHROID, LEVOTHROID) 50 MCG tablet Take 1 tablet (50 mcg total) by mouth daily. 05/07/15  Yes [name], MD  Melatonin 3 MG CAPS Take 3 mg by mouth at bedtime.   Yes Historical Provider, MD  memantine (NAMENDA) 10 MG tablet TAKE 1 TABLET BY MOUTH TWICE DAILY Patient taking differently: Take 10 mg by mouth twice daily 08/07/15  Yes [name], MD  naproxen sodium (ANAPROX) 220 MG tablet Take 220 mg by mouth 2 (two) times daily with a meal.     Yes Historical Provider, MD  OLANZapine (ZYPREXA)  10 MG tablet Take 1 tablet (10 mg total) by mouth at bedtime. Patient taking differently: Take 5 mg by mouth 2 (two) times daily.  09/08/15  Yes [name], MD  PARoxetine (PAXIL) 10 MG tablet TAKE 1 TABLET BY MOUTH EVERY DAY Patient taking differently: Take 10 mg by mouth daily 08/07/15  Yes [name], MD  ramipril (ALTACE) 10 MG capsule TAKE ONE CAPSULE BY MOUTH DAILY Patient taking differently: Take 10 mg by mouth daily 05/18/15  Yes [name], MD  TRAVATAN Z 0.004 % SOLN ophthalmic solution Place 1 drop into both eyes at bedtime.  03/03/13  Yes Historical Provider, MD  memantine (NAMENDA XR) 28 MG CP24 24 hr capsule Take 1 capsule (28 mg total) by mouth daily. Patient not taking: Reported on 10/21/2015 07/06/15   [name], MD  Spacer/Aero-Holding Chambers  (AEROCHAMBER PLUS WITH MASK) inhaler Use as instructed Patient not taking: Reported on 10/21/2015 10/23/14   [name], MD    Physical Exam: Filed Vitals:   10/22/15 1610 10/22/15 0238 10/22/15 0438 10/22/15 0500  BP: 149/57 130/57 178/52   Pulse: 64 68 66   Temp:  98 F (36.7 C) 99 F (37.2 C)   TempSrc:  Oral Oral   Resp: 17 18 18    Height:    4\' 7"  (1.397 m)  Weight:    120 lb (54.432 kg)  SpO2: 96% 96% 100%       Constitutional: Not in distress. Filed Vitals:   10/22/15 0204 10/22/15 0238 10/22/15 0438 10/22/15 0500  BP: 149/57 130/57 178/52   Pulse: 64 68 66   Temp:  98 F (36.7 C) 99 F (37.2 C)   TempSrc:  Oral Oral   Resp: 17 18 18    Height:    4\' 7"  (1.397 m)  Weight:    120 lb (54.432 kg)  SpO2: 96% 96% 100%    Eyes: Anicteric no pallor. ENMT: No discharge from the ears eyes nose or mouth. Neck: No mass felt. No JVD appreciated. Respiratory: No rhonchi or crepitations. Cardiovascular: S1 and S2 heard. Abdomen: Soft nontender bowel sounds present. Musculoskeletal: Pain on moving left hip. Skin: Chronic skin changes. Neurologic: Alert awake oriented to her name. Moves all extremities. Psychiatric: Has dementia.   Labs on Admission: I have personally reviewed following labs and imaging studies  CBC:  Recent Labs Lab 10/22/15 0021  WBC 15.2*  NEUTROABS 12.9*  HGB 10.7*  HCT 34.0*  MCV 93.7  PLT 219   Basic Metabolic Panel:  Recent Labs Lab 10/22/15 0021  NA 141  K 4.2  CL 110  CO2 25  GLUCOSE 127*  BUN 27*  CREATININE 0.92  CALCIUM 8.7*   GFR: Estimated Creatinine Clearance: 25.5 mL/min (by C-G formula based on Cr of 0.92). Liver Function Tests: No results for input(s): AST, ALT, ALKPHOS, BILITOT, PROT, ALBUMIN in the last 168 hours. No results for input(s): LIPASE, AMYLASE in the last 168 hours. No results for input(s): AMMONIA in the last 168 hours. Coagulation Profile:  Recent Labs Lab 10/22/15 0021  INR 1.01   Cardiac  Enzymes: No results for input(s): CKTOTAL, CKMB, CKMBINDEX, TROPONINI in the last 168 hours. BNP (last 3 results) No results for input(s): PROBNP in the last 8760 hours. HbA1C: No results for input(s): HGBA1C in the last 72 hours. CBG: No results for input(s): GLUCAP in the last 168 hours. Lipid Profile: No results for input(s): CHOL, HDL, LDLCALC, TRIG, CHOLHDL, LDLDIRECT in the last  72 hours. Thyroid Function Tests: No results for input(s): TSH, T4TOTAL, FREET4, T3FREE, THYROIDAB in the last 72 hours. Anemia Panel: No results for input(s): VITAMINB12, FOLATE, FERRITIN, TIBC, IRON, RETICCTPCT in the last 72 hours. Urine analysis:    Component Value Date/Time   COLORURINE YELLOW 12/20/2009 1136   APPEARANCEUR CLEAR 12/20/2009 1136   LABSPEC 1.011 12/20/2009 1136   PHURINE 6.0 12/20/2009 1136   GLUCOSEU NEGATIVE 12/20/2009 1136   HGBUR NEGATIVE 12/20/2009 1136   BILIRUBINUR neg 11/11/2013 1154   BILIRUBINUR NEGATIVE 12/20/2009 1136   KETONESUR NEGATIVE 12/20/2009 1136   PROTEINUR neg 11/11/2013 1154   PROTEINUR NEGATIVE 12/20/2009 1136   UROBILINOGEN negative 11/11/2013 1154   UROBILINOGEN 0.2 12/20/2009 1136   NITRITE neg 11/11/2013 1154   NITRITE NEGATIVE 12/20/2009 1136   LEUKOCYTESUR Negative 11/11/2013 1154   Sepsis Labs: @LABRCNTIP (procalcitonin:4,lacticidven:4) )No results found for this or any previous visit (from the past 240 hour(s)).   Radiological Exams on Admission: Dg Elbow Complete Left  10/22/2015  CLINICAL DATA:  Fall with hematoma to the left elbow. Initial encounter. EXAM: LEFT ELBOW - COMPLETE 3+ VIEW COMPARISON:  None. FINDINGS: Soft tissue swelling over the olecranon process correlating with history. No joint effusion, fracture, or dislocation. IMPRESSION: Soft tissue swelling without acute osseous finding. Electronically Signed   By: Marnee Spring M.D.   On: 10/22/2015 00:35   Dg Forearm Left  10/22/2015  CLINICAL DATA:  Fall with swelling to the  olecranon process. Initial encounter. EXAM: LEFT FOREARM - 2 VIEW COMPARISON:  08/20/2010 wrist radiography FINDINGS: Soft tissue swelling over the olecranon process. Negative for acute fracture or dislocation. There is a remote distal radius fracture with healed malalignment. Remote ulnar styloid process fracture. Osteopenia. First [facility] osteoarthritis. IMPRESSION: 1. No acute osseous finding. 2. Remote distal radius and ulnar fractures. Electronically Signed   By: Marnee Spring M.D.   On: 10/22/2015 00:37   Dg Knee 1-2 Views Left  10/22/2015  CLINICAL DATA:  Initial evaluation for acute trauma, fall. EXAM: LEFT KNEE - 1-2 VIEW COMPARISON:  None. FINDINGS: Cemented total knee arthroplasty in place. No periprosthetic fracture. No periprosthetic lucency to suggest loosening or infection. No other acute osseous abnormality about the knee. Osteopenia. No soft tissue abnormality. IMPRESSION: 1. No acute fracture or dislocation. 2. Left total knee arthroplasty in place without complication. 3. Osteopenia. Electronically Signed   By: Rise Mu M.D.   On: 10/22/2015 00:40   Ct Head Wo Contrast  10/22/2015  CLINICAL DATA:  Initial evaluation for acute trauma, unwitnessed fall. EXAM: CT HEAD WITHOUT CONTRAST CT CERVICAL SPINE WITHOUT CONTRAST TECHNIQUE: Multidetector CT imaging of the head and cervical spine was performed following the standard protocol without intravenous contrast. Multiplanar CT image reconstructions of the cervical spine were also generated. COMPARISON:  Prior CT from 08/20/2010. FINDINGS: CT HEAD FINDINGS Scalp soft tissues demonstrate no acute abnormality. No acute abnormality about the orbits. Paranasal sinuses are clear.  No mastoid effusion. Calvarium intact. Scattered vascular calcifications within the carotid siphons. Age-related cerebral atrophy with mild chronic small vessel ischemic disease. No acute intracranial hemorrhage or large vessel territory infarct. No mass lesion,  midline shift, or mass effect. No hydrocephalus. No extra-axial fluid collection. CT CERVICAL SPINE FINDINGS Reversal of the normal cervical lordosis with apex at C5. Trace anterolisthesis of C3 on C4 and C4 on C5. Vertebral body heights are preserved. Normal C1-2 articulations are intact. No prevertebral soft tissue swelling. No acute fracture or listhesis. Incomplete fusion of the posterior ring of  C1 noted. Moderate to advanced degenerative spondylolysis at C4-5, C5-6, and C6-7. Multilevel facet arthrosis within the upper cervical spine. Visualized soft tissues of the neck are within normal limits. Visualized lung apices are clear without evidence of apical pneumothorax. 4 mm nodule within the right upper lobe (series 7, image 65). IMPRESSION: CT BRAIN: 1. No acute intracranial process. 2. Generalized cerebral atrophy with mild chronic microvascular ischemic disease, stable. CT CERVICAL SPINE: 1. No acute traumatic injury within cervical spine. 2. Reversal of the normal cervical lordosis with advanced degenerative spondylolysis at C4-5, C5-6, and C6-7, stable. 3. 4 mm right upper lobe nodule, indeterminate. No follow-up needed if patient is low-risk. Non-contrast chest CT can be considered in 12 months if patient is high-risk. This recommendation follows the consensus statement: Guidelines for Management of Incidental Pulmonary Nodules Detected on CT Images:From the Fleischner Society 2017; published online before print (10.1148/[id number]). Electronically Signed   By: Rise MuBenjamin  McClintock M.D.   On: 10/22/2015 01:42   Ct Cervical Spine Wo Contrast  10/22/2015  CLINICAL DATA:  Initial evaluation for acute trauma, unwitnessed fall. EXAM: CT HEAD WITHOUT CONTRAST CT CERVICAL SPINE WITHOUT CONTRAST TECHNIQUE: Multidetector CT imaging of the head and cervical spine was performed following the standard protocol without intravenous contrast. Multiplanar CT image reconstructions of the cervical spine were also  generated. COMPARISON:  Prior CT from 08/20/2010. FINDINGS: CT HEAD FINDINGS Scalp soft tissues demonstrate no acute abnormality. No acute abnormality about the orbits. Paranasal sinuses are clear.  No mastoid effusion. Calvarium intact. Scattered vascular calcifications within the carotid siphons. Age-related cerebral atrophy with mild chronic small vessel ischemic disease. No acute intracranial hemorrhage or large vessel territory infarct. No mass lesion, midline shift, or mass effect. No hydrocephalus. No extra-axial fluid collection. CT CERVICAL SPINE FINDINGS Reversal of the normal cervical lordosis with apex at C5. Trace anterolisthesis of C3 on C4 and C4 on C5. Vertebral body heights are preserved. Normal C1-2 articulations are intact. No prevertebral soft tissue swelling. No acute fracture or listhesis. Incomplete fusion of the posterior ring of C1 noted. Moderate to advanced degenerative spondylolysis at C4-5, C5-6, and C6-7. Multilevel facet arthrosis within the upper cervical spine. Visualized soft tissues of the neck are within normal limits. Visualized lung apices are clear without evidence of apical pneumothorax. 4 mm nodule within the right upper lobe (series 7, image 65). IMPRESSION: CT BRAIN: 1. No acute intracranial process. 2. Generalized cerebral atrophy with mild chronic microvascular ischemic disease, stable. CT CERVICAL SPINE: 1. No acute traumatic injury within cervical spine. 2. Reversal of the normal cervical lordosis with advanced degenerative spondylolysis at C4-5, C5-6, and C6-7, stable. 3. 4 mm right upper lobe nodule, indeterminate. No follow-up needed if patient is low-risk. Non-contrast chest CT can be considered in 12 months if patient is high-risk. This recommendation follows the consensus statement: Guidelines for Management of Incidental Pulmonary Nodules Detected on CT Images:From the Fleischner Society 2017; published online before print (10.1148/[id number]).  Electronically Signed   By: Rise MuBenjamin  McClintock M.D.   On: 10/22/2015 01:42   Dg Chest Port 1 View  10/22/2015  CLINICAL DATA:  Preop for hip fracture. EXAM: PORTABLE CHEST 1 VIEW COMPARISON:  07/02/2014 FINDINGS: Chronic cardiomegaly. Lower mediastinal widening from hiatal hernia, better seen previously. Negative aortic contours. Interstitial coarsening likely from low volumes. No edema, pneumonia, effusion, or pneumothorax. No appreciable fracture. IMPRESSION: Cardiomegaly without failure. Electronically Signed   By: Marnee SpringJonathon  Watts M.D.   On: 10/22/2015 01:01   Dg Hip Unilat With Pelvis  2-3 Views Left  10/22/2015  CLINICAL DATA:  Fall with left hip deformity.  Initial encounter. EXAM: DG HIP (WITH OR WITHOUT PELVIS) 2-3V LEFT COMPARISON:  None. FINDINGS: Acute intertrochanteric left femur fracture with varus angulation from medial impaction. Located hips. No evidence of pelvic ring fracture or diastasis. Osteopenia and atherosclerosis. IMPRESSION: Displaced intertrochanteric left femur fracture. Electronically Signed   By: Marnee Spring M.D.   On: 10/22/2015 00:34   Dg Femur Min 2 Views Left  10/22/2015  CLINICAL DATA:  Fall with left hip deformity.  Initial encounter. EXAM: LEFT FEMUR 2 VIEWS COMPARISON:  None. FINDINGS: An intertrochanteric femur fracture is described on contemporaneous hip radiography. No distal fracture is seen. Status post total knee arthroplasty with partial visualization of the tibial component. Osteopenia. IMPRESSION: Displaced intertrochanteric left femur fracture. Electronically Signed   By: Marnee Spring M.D.   On: 10/22/2015 00:39    EKG: Independently reviewed. Normal sinus rhythm.  Assessment/Plan Principal Problem:   Closed left hip fracture (HCC) Active Problems:   Dementia   Hypothyroidism   Essential hypertension   Hip fracture (HCC)    #1. Left hip fracture status post mechanical fall - from medical standpoint of view patient is at moderate risk for  intermediate risk procedure. Will keep patient nothing by mouth in anticipation of left hip surgery. Dr. Ranell Patrick orthopedic surgeon was consulted by the ER physician. Continue pain to any medications. #2. Hypertension uncontrolled probably secondary to pain - in addition to patient's home medications will keep patient on when necessary IV hydralazine. Closely follow blood pressure trends. #3. Leukocytosis probably reactionary - UA is pending. Chest x-ray was unremarkable. Patient is afebrile. Closely follow CBC trends. #4. Hypothyroidism on Synthroid. #5. Dementia continue present medications including Namenda. #6. Iron deficiency anemia on iron supplements.   DVT prophylaxis: SCDs. Code Status: DO NOT RESUSCITATE.  Family Communication: Patient's daughter.  Disposition Plan: Possibly skilled nursing facility.  Consults called: Orthopedic surgery.  Admission status: Inpatient. MedSurg.    Eduard Clos MD [facility] Pager [phone].  If 7PM-7AM, please contact night-coverage [email or web address] Password [email or web address]  10/22/2015, 5:05 AM

## 2015-10-22 NOTE — Anesthesia Preprocedure Evaluation (Signed)
Anesthesia Evaluation  Patient identified by MRN, date of birth, ID band Patient awake    Reviewed: Allergy & Precautions, NPO status , Patient's Chart, lab work & pertinent test results  History of Anesthesia Complications Negative for: history of anesthetic complications  Airway Mallampati: II  TM Distance: >3 FB Neck ROM: Full    Dental  (+) Partial Upper, Teeth Intact   Pulmonary neg pulmonary ROS,    breath sounds clear to auscultation       Cardiovascular hypertension, Pt. on medications (-) angina(-) Past MI and (-) CHF  Rhythm:Regular     Neuro/Psych PSYCHIATRIC DISORDERS Anxiety Depression dementianegative neurological ROS     GI/Hepatic negative GI ROS, Neg liver ROS,   Endo/Other  Hypothyroidism   Renal/GU negative Renal ROS     Musculoskeletal  (+) Arthritis , Left hip fracture   Abdominal   Peds  Hematology  (+) anemia ,   Anesthesia Other Findings   Reproductive/Obstetrics                             Anesthesia Physical Anesthesia Plan  ASA: III  Anesthesia Plan: Spinal   Post-op Pain Management:    Induction: Intravenous  Airway Management Planned: Natural Airway, Nasal Cannula and Simple Face Mask  Additional Equipment: None  Intra-op Plan:   Post-operative Plan:   Informed Consent: I have reviewed the patients History and Physical, chart, labs and discussed the procedure including the risks, benefits and alternatives for the proposed anesthesia with the patient or authorized representative who has indicated his/her understanding and acceptance.   Dental advisory given  Plan Discussed with: Surgeon and CRNA  Anesthesia Plan Comments:         Anesthesia Quick Evaluation

## 2015-10-22 NOTE — ED Provider Notes (Signed)
CSN: [id number]     Arrival date & time 10/21/15  2244 History   By signing my name below, I, Laura Lowe. Laura Lowe, attest that this documentation has been prepared under the direction and in the presence of [name], MD.  Electronically Signed: Suzan Lowe. Laura Lowe, ED Scribe. 10/22/2015. 12:46 AM.   Chief Complaint  Patient presents with  . Fall   The history is provided by the patient. No language interpreter was used.    LEVEL 5 CAVEAT DUE TO DEMENTIA   HPI Comments: Laura Lowe is a 82 y.o. female with a PMHx of HTN and dementia who presents to the Emergency Department here after an unwitnessed fall sustained just prior to arrival. Family states pt may have fell down four brick stairs as she went outside. Daughter found her on the floor on her left hip. Head trauma or LOC unknown. Pt now c/o constant, ongoing L leg pain and states "i think i broke my hip" to daughter. Pt is unable to bear weight on her L leg at this time per family. Pt was given nightly medications prior to fall consisting of Melatonin and Zyprexa 5 mg. At baseline, pt is typically steady on her feet. She is not currently on any blood thinners.   PCP: [name], MD   ORTHOPEDIST: [name], MD  Past Medical History  Diagnosis Date  . Glaucoma   . Arthritis   . Hypertension   . Osteoporosis   . Allergy   . Counseling for estrogen replacement therapy   . IBS (irritable bowel syndrome)   . Anxiety   . Depression   . HSV-1 (herpes simplex virus 1) infection   . Carotid bruit   . Hearing loss   . Vitamin D deficiency   . Dementia    Past Surgical History  Procedure Laterality Date  . Abdominal hysterectomy    . Rotator cuff repair    . Eye surgery      cataract OD & OS  . Joint replacement Left     Knee   Family History  Problem Relation Age of Onset  . Cancer Mother   . Heart disease Mother   . Hypertension Mother   . Stroke Father    Social History  Substance Use Topics  .  Smoking status: Never Smoker   . Smokeless tobacco: Never Used  . Alcohol Use: No   OB History    No data available     Review of Systems  LEVEL 5 CAVEAT- DEMENTIA   Allergies  Risedronate sodium  Home Medications   Prior to Admission medications   Medication Sig Start Date End Date Taking? Authorizing Provider  albuterol (PROVENTIL HFA;VENTOLIN HFA) 108 (90 BASE) MCG/ACT inhaler Inhale 2 puffs into the lungs every 6 (six) hours as needed for wheezing or shortness of breath. 10/23/14  Yes [name], MD  diazepam (VALIUM) 5 MG tablet Take 1 tablet (5 mg total) by mouth 2 (two) times daily as needed. for anxiety 05/07/15  Yes [name], MD  Ferrous Sulfate 250 MG CPCR Take 250 mg by mouth daily.    Yes Historical Provider, MD  fluticasone (FLONASE) 50 MCG/ACT nasal spray Place 2 sprays into the nose daily as needed for allergies.    Yes Historical Provider, MD  levothyroxine (SYNTHROID, LEVOTHROID) 50 MCG tablet Take 1 tablet (50 mcg total) by mouth daily. 05/07/15  Yes [name], MD  Melatonin 3 MG CAPS Take 3 mg  by mouth at bedtime.   Yes Historical Provider, MD  memantine (NAMENDA) 10 MG tablet TAKE 1 TABLET BY MOUTH TWICE DAILY Patient taking differently: Take 10 mg by mouth twice daily 08/07/15  Yes Margaree MackintoshMary J Baxley, MD  naproxen sodium (ANAPROX) 220 MG tablet Take 220 mg by mouth 2 (two) times daily with a meal.     Yes Historical Provider, MD  OLANZapine (ZYPREXA) 10 MG tablet Take 1 tablet (10 mg total) by mouth at bedtime. Patient taking differently: Take 5 mg by mouth 2 (two) times daily.  09/08/15  Yes Margaree MackintoshMary J Baxley, MD  PARoxetine (PAXIL) 10 MG tablet TAKE 1 TABLET BY MOUTH EVERY DAY Patient taking differently: Take 10 mg by mouth daily 08/07/15  Yes Margaree MackintoshMary J Baxley, MD  ramipril (ALTACE) 10 MG capsule TAKE ONE CAPSULE BY MOUTH DAILY Patient taking differently: Take 10 mg by mouth daily 05/18/15  Yes Margaree MackintoshMary J Baxley, MD  TRAVATAN Z 0.004 % SOLN ophthalmic solution Place 1  drop into both eyes at bedtime.  03/03/13  Yes Historical Provider, MD  memantine (NAMENDA XR) 28 MG CP24 24 hr capsule Take 1 capsule (28 mg total) by mouth daily. Patient not taking: Reported on 10/21/2015 07/06/15   Margaree MackintoshMary J Baxley, MD  Spacer/Aero-Holding Chambers (AEROCHAMBER PLUS WITH MASK) inhaler Use as instructed Patient not taking: Reported on 10/21/2015 10/23/14   Margaree MackintoshMary J Baxley, MD   Triage Vitals: BP 203/58 mmHg  Pulse 56  Temp(Src) 97.7 F (36.5 C) (Oral)  Resp 14  SpO2 97%   Physical Exam  Constitutional: She appears well-developed and well-nourished. No distress.  HENT:  Head: Normocephalic and atraumatic.  Moist mucous membranes  Eyes: Conjunctivae are normal. Pupils are equal, round, and reactive to light.  Neck: Normal range of motion. Neck supple.  No midline cervical spine tenderness   Cardiovascular: Normal rate, regular rhythm, normal heart sounds and intact distal pulses.   No murmur heard. Pulmonary/Chest: Effort normal and breath sounds normal. She exhibits no tenderness.  Abdominal: Soft. Bowel sounds are normal. She exhibits no distension. There is no tenderness.  Musculoskeletal: She exhibits no edema.  L leg shortened and externally rotated No tenderness of knees  Neurological: She is alert.  Normal sensation BLE Hard of hearing   Skin: Skin is warm and dry.  Ecchymosis to pad of R index finger and volar L wrist  Ecchymosis to mid R upper arm   Psychiatric: She has a normal mood and affect. Judgment normal.  Nursing note and vitals reviewed.   ED Course  Procedures (including critical care time)  DIAGNOSTIC STUDIES: Oxygen Saturation is 97% on RA, Normal by my interpretation.    COORDINATION OF CARE: 12:31 AM- Will order imaging, blood work, and urinalysis. Discussed treatment plan with pt at bedside and pt agreed to plan.     Labs Review Labs Reviewed  BASIC METABOLIC PANEL - Abnormal; Notable for the following:    Glucose, Bld 127 (*)    BUN  27 (*)    Calcium 8.7 (*)    GFR calc non Af Amer 52 (*)    All other components within normal limits  CBC WITH DIFFERENTIAL/PLATELET - Abnormal; Notable for the following:    WBC 15.2 (*)    RBC 3.63 (*)    Hemoglobin 10.7 (*)    HCT 34.0 (*)    Neutro Abs 12.9 (*)    All other components within normal limits  CBC WITH DIFFERENTIAL/PLATELET - Abnormal; Notable for the following:  WBC 13.6 (*)    RBC 3.59 (*)    Hemoglobin 10.2 (*)    HCT 32.6 (*)    Neutro Abs 10.0 (*)    Monocytes Absolute 1.4 (*)    All other components within normal limits  COMPREHENSIVE METABOLIC PANEL - Abnormal; Notable for the following:    Glucose, Bld 125 (*)    BUN 25 (*)    Total Protein 5.9 (*)    ALT 12 (*)    GFR calc non Af Amer 60 (*)    All other components within normal limits  GLUCOSE, CAPILLARY - Abnormal; Notable for the following:    Glucose-Capillary 108 (*)    All other components within normal limits  URINE CULTURE  SURGICAL PCR SCREEN  PROTIME-INR  URINALYSIS, ROUTINE W REFLEX MICROSCOPIC (NOT AT [facility])  TYPE AND SCREEN    Imaging Review Dg Elbow Complete Left  10/22/2015  CLINICAL DATA:  Fall with hematoma to the left elbow. Initial encounter. EXAM: LEFT ELBOW - COMPLETE 3+ VIEW COMPARISON:  None. FINDINGS: Soft tissue swelling over the olecranon process correlating with history. No joint effusion, fracture, or dislocation. IMPRESSION: Soft tissue swelling without acute osseous finding. Electronically Signed   By: Marnee Spring M.D.   On: 10/22/2015 00:35   Dg Forearm Left  10/22/2015  CLINICAL DATA:  Fall with swelling to the olecranon process. Initial encounter. EXAM: LEFT FOREARM - 2 VIEW COMPARISON:  08/20/2010 wrist radiography FINDINGS: Soft tissue swelling over the olecranon process. Negative for acute fracture or dislocation. There is a remote distal radius fracture with healed malalignment. Remote ulnar styloid process fracture. Osteopenia. First [facility] osteoarthritis.  IMPRESSION: 1. No acute osseous finding. 2. Remote distal radius and ulnar fractures. Electronically Signed   By: Marnee Spring M.D.   On: 10/22/2015 00:37   Dg Knee 1-2 Views Left  10/22/2015  CLINICAL DATA:  Initial evaluation for acute trauma, fall. EXAM: LEFT KNEE - 1-2 VIEW COMPARISON:  None. FINDINGS: Cemented total knee arthroplasty in place. No periprosthetic fracture. No periprosthetic lucency to suggest loosening or infection. No other acute osseous abnormality about the knee. Osteopenia. No soft tissue abnormality. IMPRESSION: 1. No acute fracture or dislocation. 2. Left total knee arthroplasty in place without complication. 3. Osteopenia. Electronically Signed   By: Rise Mu M.D.   On: 10/22/2015 00:40   Ct Head Wo Contrast  10/22/2015  CLINICAL DATA:  Initial evaluation for acute trauma, unwitnessed fall. EXAM: CT HEAD WITHOUT CONTRAST CT CERVICAL SPINE WITHOUT CONTRAST TECHNIQUE: Multidetector CT imaging of the head and cervical spine was performed following the standard protocol without intravenous contrast. Multiplanar CT image reconstructions of the cervical spine were also generated. COMPARISON:  Prior CT from 08/20/2010. FINDINGS: CT HEAD FINDINGS Scalp soft tissues demonstrate no acute abnormality. No acute abnormality about the orbits. Paranasal sinuses are clear.  No mastoid effusion. Calvarium intact. Scattered vascular calcifications within the carotid siphons. Age-related cerebral atrophy with mild chronic small vessel ischemic disease. No acute intracranial hemorrhage or large vessel territory infarct. No mass lesion, midline shift, or mass effect. No hydrocephalus. No extra-axial fluid collection. CT CERVICAL SPINE FINDINGS Reversal of the normal cervical lordosis with apex at C5. Trace anterolisthesis of C3 on C4 and C4 on C5. Vertebral body heights are preserved. Normal C1-2 articulations are intact. No prevertebral soft tissue swelling. No acute fracture or listhesis.  Incomplete fusion of the posterior ring of C1 noted. Moderate to advanced degenerative spondylolysis at C4-5, C5-6, and C6-7. Multilevel facet arthrosis  within the upper cervical spine. Visualized soft tissues of the neck are within normal limits. Visualized lung apices are clear without evidence of apical pneumothorax. 4 mm nodule within the right upper lobe (series 7, image 65). IMPRESSION: CT BRAIN: 1. No acute intracranial process. 2. Generalized cerebral atrophy with mild chronic microvascular ischemic disease, stable. CT CERVICAL SPINE: 1. No acute traumatic injury within cervical spine. 2. Reversal of the normal cervical lordosis with advanced degenerative spondylolysis at C4-5, C5-6, and C6-7, stable. 3. 4 mm right upper lobe nodule, indeterminate. No follow-up needed if patient is low-risk. Non-contrast chest CT can be considered in 12 months if patient is high-risk. This recommendation follows the consensus statement: Guidelines for Management of Incidental Pulmonary Nodules Detected on CT Images:From the Fleischner Society 2017; published online before print (10.1148/[id number]). Electronically Signed   By: Rise Mu M.D.   On: 10/22/2015 01:42   Ct Cervical Spine Wo Contrast  10/22/2015  CLINICAL DATA:  Initial evaluation for acute trauma, unwitnessed fall. EXAM: CT HEAD WITHOUT CONTRAST CT CERVICAL SPINE WITHOUT CONTRAST TECHNIQUE: Multidetector CT imaging of the head and cervical spine was performed following the standard protocol without intravenous contrast. Multiplanar CT image reconstructions of the cervical spine were also generated. COMPARISON:  Prior CT from 08/20/2010. FINDINGS: CT HEAD FINDINGS Scalp soft tissues demonstrate no acute abnormality. No acute abnormality about the orbits. Paranasal sinuses are clear.  No mastoid effusion. Calvarium intact. Scattered vascular calcifications within the carotid siphons. Age-related cerebral atrophy with mild chronic small vessel  ischemic disease. No acute intracranial hemorrhage or large vessel territory infarct. No mass lesion, midline shift, or mass effect. No hydrocephalus. No extra-axial fluid collection. CT CERVICAL SPINE FINDINGS Reversal of the normal cervical lordosis with apex at C5. Trace anterolisthesis of C3 on C4 and C4 on C5. Vertebral body heights are preserved. Normal C1-2 articulations are intact. No prevertebral soft tissue swelling. No acute fracture or listhesis. Incomplete fusion of the posterior ring of C1 noted. Moderate to advanced degenerative spondylolysis at C4-5, C5-6, and C6-7. Multilevel facet arthrosis within the upper cervical spine. Visualized soft tissues of the neck are within normal limits. Visualized lung apices are clear without evidence of apical pneumothorax. 4 mm nodule within the right upper lobe (series 7, image 65). IMPRESSION: CT BRAIN: 1. No acute intracranial process. 2. Generalized cerebral atrophy with mild chronic microvascular ischemic disease, stable. CT CERVICAL SPINE: 1. No acute traumatic injury within cervical spine. 2. Reversal of the normal cervical lordosis with advanced degenerative spondylolysis at C4-5, C5-6, and C6-7, stable. 3. 4 mm right upper lobe nodule, indeterminate. No follow-up needed if patient is low-risk. Non-contrast chest CT can be considered in 12 months if patient is high-risk. This recommendation follows the consensus statement: Guidelines for Management of Incidental Pulmonary Nodules Detected on CT Images:From the Fleischner Society 2017; published online before print (10.1148/[id number]). Electronically Signed   By: Rise Mu M.D.   On: 10/22/2015 01:42   Dg Chest Port 1 View  10/22/2015  CLINICAL DATA:  Preop for hip fracture. EXAM: PORTABLE CHEST 1 VIEW COMPARISON:  07/02/2014 FINDINGS: Chronic cardiomegaly. Lower mediastinal widening from hiatal hernia, better seen previously. Negative aortic contours. Interstitial coarsening likely from  low volumes. No edema, pneumonia, effusion, or pneumothorax. No appreciable fracture. IMPRESSION: Cardiomegaly without failure. Electronically Signed   By: Marnee Spring M.D.   On: 10/22/2015 01:01   Dg Hip Unilat With Pelvis 2-3 Views Left  10/22/2015  CLINICAL DATA:  Fall with left hip deformity.  Initial encounter. EXAM: DG HIP (WITH OR WITHOUT PELVIS) 2-3V LEFT COMPARISON:  None. FINDINGS: Acute intertrochanteric left femur fracture with varus angulation from medial impaction. Located hips. No evidence of pelvic ring fracture or diastasis. Osteopenia and atherosclerosis. IMPRESSION: Displaced intertrochanteric left femur fracture. Electronically Signed   By: Marnee Spring M.D.   On: 10/22/2015 00:34   Dg Femur Min 2 Views Left  10/22/2015  CLINICAL DATA:  Fall with left hip deformity.  Initial encounter. EXAM: LEFT FEMUR 2 VIEWS COMPARISON:  None. FINDINGS: An intertrochanteric femur fracture is described on contemporaneous hip radiography. No distal fracture is seen. Status post total knee arthroplasty with partial visualization of the tibial component. Osteopenia. IMPRESSION: Displaced intertrochanteric left femur fracture. Electronically Signed   By: Marnee Spring M.D.   On: 10/22/2015 00:39   I have personally reviewed and evaluated these images and lab results as part of my medical decision-making.   EKG Interpretation None      MDM   Final diagnoses:  Closed left hip fracture, initial encounter [facility])   Patient presents with left hip pain after fall. On exam, left leg shortened and externally rotated, neurovascularly intact. No other signs of acute trauma on exam. CT of head and C-spine negative for acute process. Hip films show displaced intertrochanteric left femur fracture. Basic screening lab work is unremarkable. I discussed with orthopedics, Dr. Ranell Patrick, who requested NPO status for possible surgery today. Discussed with hospitalist, Dr. Toniann Fail, who will admit pt for further  care.  I personally performed the services described in this documentation, which was scribed in my presence. The recorded information has been reviewed and is accurate.   [name], MD 10/22/15 [phone]

## 2015-10-22 NOTE — Consult Note (Signed)
Reason for Consult: Broken left hip Referring Physician: Tkai Large is an 82 y.o. female.  HPI: 82 yo female with dementia who fell yesterday injuring her left hip.  Patient unable to stand or bear weight on the left leg after the fall.  Patient presented to [facility] ED for eval and treatment.  She is currently complaining of left hip pain but denies other injuries other than some bruising on her left elbow.  Past Medical History  Diagnosis Date  . Glaucoma   . Arthritis   . Hypertension   . Osteoporosis   . Allergy   . Counseling for estrogen replacement therapy   . IBS (irritable bowel syndrome)   . Anxiety   . Depression   . HSV-1 (herpes simplex virus 1) infection   . Carotid bruit   . Hearing loss   . Vitamin D deficiency   . Dementia     Past Surgical History  Procedure Laterality Date  . Abdominal hysterectomy    . Rotator cuff repair    . Eye surgery      cataract OD & OS  . Joint replacement Left     Knee    Family History  Problem Relation Age of Onset  . Cancer Mother   . Heart disease Mother   . Hypertension Mother   . Stroke Father     Social History:  reports that she has never smoked. She has never used smokeless tobacco. She reports that she does not drink alcohol or use illicit drugs.  Allergies:  Allergies  Allergen Reactions  . Risedronate Sodium Other (See Comments)    GI    Medications: I have reviewed the patient's current medications.  Results for orders placed or performed during the hospital encounter of 10/21/15 (from the past 48 hour(s))  Basic metabolic panel     Status: Abnormal   Collection Time: 10/22/15 12:21 AM  Result Value Ref Range   Sodium 141 135 - 145 mmol/L   Potassium 4.2 3.5 - 5.1 mmol/L   Chloride 110 101 - 111 mmol/L   CO2 25 22 - 32 mmol/L   Glucose, Bld 127 (H) 65 - 99 mg/dL   BUN 27 (H) 6 - 20 mg/dL   Creatinine, Ser 0.92 0.44 - 1.00 mg/dL   Calcium 8.7 (L) 8.9 - 10.3 mg/dL   GFR calc non Af  Amer 52 (L) >60 mL/min   GFR calc Af Amer >60 >60 mL/min    Comment: (NOTE) The eGFR has been calculated using the CKD EPI equation. This calculation has not been validated in all clinical situations. eGFR's persistently <60 mL/min signify possible Chronic Kidney Disease.    Anion gap 6 5 - 15  CBC with Differential     Status: Abnormal   Collection Time: 10/22/15 12:21 AM  Result Value Ref Range   WBC 15.2 (H) 4.0 - 10.5 K/uL   RBC 3.63 (L) 3.87 - 5.11 MIL/uL   Hemoglobin 10.7 (L) 12.0 - 15.0 g/dL   HCT 34.0 (L) 36.0 - 46.0 %   MCV 93.7 78.0 - 100.0 fL   MCH 29.5 26.0 - 34.0 pg   MCHC 31.5 30.0 - 36.0 g/dL   RDW 13.5 11.5 - 15.5 %   Platelets 219 150 - 400 K/uL   Neutrophils Relative % 84 %   Neutro Abs 12.9 (H) 1.7 - 7.7 K/uL   Lymphocytes Relative 8 %   Lymphs Abs 1.2 0.7 - 4.0 K/uL   Monocytes  Relative 7 %   Monocytes Absolute 1.0 0.1 - 1.0 K/uL   Eosinophils Relative 1 %   Eosinophils Absolute 0.1 0.0 - 0.7 K/uL   Basophils Relative 0 %   Basophils Absolute 0.0 0.0 - 0.1 K/uL  Protime-INR     Status: None   Collection Time: 10/22/15 12:21 AM  Result Value Ref Range   Prothrombin Time 13.5 11.6 - 15.2 seconds   INR 1.01 0.00 - 1.49  Type and screen [place]     Status: None (Preliminary result)   Collection Time: 10/22/15  5:23 AM  Result Value Ref Range   ABO/RH(D) O POS    Antibody Screen PENDING    Sample Expiration 10/25/2015   CBC WITH DIFFERENTIAL     Status: Abnormal   Collection Time: 10/22/15  5:33 AM  Result Value Ref Range   WBC 13.6 (H) 4.0 - 10.5 K/uL   RBC 3.59 (L) 3.87 - 5.11 MIL/uL   Hemoglobin 10.2 (L) 12.0 - 15.0 g/dL   HCT 32.6 (L) 36.0 - 46.0 %   MCV 90.8 78.0 - 100.0 fL   MCH 28.4 26.0 - 34.0 pg   MCHC 31.3 30.0 - 36.0 g/dL   RDW 13.2 11.5 - 15.5 %   Platelets 227 150 - 400 K/uL   Neutrophils Relative % 73 %   Neutro Abs 10.0 (H) 1.7 - 7.7 K/uL   Lymphocytes Relative 16 %   Lymphs Abs 2.2 0.7 - 4.0 K/uL    Monocytes Relative 10 %   Monocytes Absolute 1.4 (H) 0.1 - 1.0 K/uL   Eosinophils Relative 1 %   Eosinophils Absolute 0.1 0.0 - 0.7 K/uL   Basophils Relative 0 %   Basophils Absolute 0.0 0.0 - 0.1 K/uL  Comprehensive metabolic panel     Status: Abnormal   Collection Time: 10/22/15  5:33 AM  Result Value Ref Range   Sodium 142 135 - 145 mmol/L   Potassium 4.1 3.5 - 5.1 mmol/L   Chloride 109 101 - 111 mmol/L   CO2 26 22 - 32 mmol/L   Glucose, Bld 125 (H) 65 - 99 mg/dL   BUN 25 (H) 6 - 20 mg/dL   Creatinine, Ser 0.82 0.44 - 1.00 mg/dL   Calcium 8.9 8.9 - 10.3 mg/dL   Total Protein 5.9 (L) 6.5 - 8.1 g/dL   Albumin 3.5 3.5 - 5.0 g/dL   AST 20 15 - 41 U/L   ALT 12 (L) 14 - 54 U/L   Alkaline Phosphatase 57 38 - 126 U/L   Total Bilirubin 0.6 0.3 - 1.2 mg/dL   GFR calc non Af Amer 60 (L) >60 mL/min   GFR calc Af Amer >60 >60 mL/min    Comment: (NOTE) The eGFR has been calculated using the CKD EPI equation. This calculation has not been validated in all clinical situations. eGFR's persistently <60 mL/min signify possible Chronic Kidney Disease.    Anion gap 7 5 - 15    Dg Elbow Complete Left  10/22/2015  CLINICAL DATA:  Fall with hematoma to the left elbow. Initial encounter. EXAM: LEFT ELBOW - COMPLETE 3+ VIEW COMPARISON:  None. FINDINGS: Soft tissue swelling over the olecranon process correlating with history. No joint effusion, fracture, or dislocation. IMPRESSION: Soft tissue swelling without acute osseous finding. Electronically Signed   By: Monte Fantasia M.D.   On: 10/22/2015 00:35   Dg Forearm Left  10/22/2015  CLINICAL DATA:  Fall with swelling to the olecranon process. Initial  encounter. EXAM: LEFT FOREARM - 2 VIEW COMPARISON:  08/20/2010 wrist radiography FINDINGS: Soft tissue swelling over the olecranon process. Negative for acute fracture or dislocation. There is a remote distal radius fracture with healed malalignment. Remote ulnar styloid process fracture. Osteopenia. First  CMC osteoarthritis. IMPRESSION: 1. No acute osseous finding. 2. Remote distal radius and ulnar fractures. Electronically Signed   By: Monte Fantasia M.D.   On: 10/22/2015 00:37   Dg Knee 1-2 Views Left  10/22/2015  CLINICAL DATA:  Initial evaluation for acute trauma, fall. EXAM: LEFT KNEE - 1-2 VIEW COMPARISON:  None. FINDINGS: Cemented total knee arthroplasty in place. No periprosthetic fracture. No periprosthetic lucency to suggest loosening or infection. No other acute osseous abnormality about the knee. Osteopenia. No soft tissue abnormality. IMPRESSION: 1. No acute fracture or dislocation. 2. Left total knee arthroplasty in place without complication. 3. Osteopenia. Electronically Signed   By: Jeannine Boga M.D.   On: 10/22/2015 00:40   Ct Head Wo Contrast  10/22/2015  CLINICAL DATA:  Initial evaluation for acute trauma, unwitnessed fall. EXAM: CT HEAD WITHOUT CONTRAST CT CERVICAL SPINE WITHOUT CONTRAST TECHNIQUE: Multidetector CT imaging of the head and cervical spine was performed following the standard protocol without intravenous contrast. Multiplanar CT image reconstructions of the cervical spine were also generated. COMPARISON:  Prior CT from 08/20/2010. FINDINGS: CT HEAD FINDINGS Scalp soft tissues demonstrate no acute abnormality. No acute abnormality about the orbits. Paranasal sinuses are clear.  No mastoid effusion. Calvarium intact. Scattered vascular calcifications within the carotid siphons. Age-related cerebral atrophy with mild chronic small vessel ischemic disease. No acute intracranial hemorrhage or large vessel territory infarct. No mass lesion, midline shift, or mass effect. No hydrocephalus. No extra-axial fluid collection. CT CERVICAL SPINE FINDINGS Reversal of the normal cervical lordosis with apex at C5. Trace anterolisthesis of C3 on C4 and C4 on C5. Vertebral body heights are preserved. Normal C1-2 articulations are intact. No prevertebral soft tissue swelling. No acute  fracture or listhesis. Incomplete fusion of the posterior ring of C1 noted. Moderate to advanced degenerative spondylolysis at C4-5, C5-6, and C6-7. Multilevel facet arthrosis within the upper cervical spine. Visualized soft tissues of the neck are within normal limits. Visualized lung apices are clear without evidence of apical pneumothorax. 4 mm nodule within the right upper lobe (series 7, image 65). IMPRESSION: CT BRAIN: 1. No acute intracranial process. 2. Generalized cerebral atrophy with mild chronic microvascular ischemic disease, stable. CT CERVICAL SPINE: 1. No acute traumatic injury within cervical spine. 2. Reversal of the normal cervical lordosis with advanced degenerative spondylolysis at C4-5, C5-6, and C6-7, stable. 3. 4 mm right upper lobe nodule, indeterminate. No follow-up needed if patient is low-risk. Non-contrast chest CT can be considered in 12 months if patient is high-risk. This recommendation follows the consensus statement: Guidelines for Management of Incidental Pulmonary Nodules Detected on CT Images:From the Fleischner Society 2017; published online before print (10.1148/[id number]). Electronically Signed   By: Jeannine Boga M.D.   On: 10/22/2015 01:42   Ct Cervical Spine Wo Contrast  10/22/2015  CLINICAL DATA:  Initial evaluation for acute trauma, unwitnessed fall. EXAM: CT HEAD WITHOUT CONTRAST CT CERVICAL SPINE WITHOUT CONTRAST TECHNIQUE: Multidetector CT imaging of the head and cervical spine was performed following the standard protocol without intravenous contrast. Multiplanar CT image reconstructions of the cervical spine were also generated. COMPARISON:  Prior CT from 08/20/2010. FINDINGS: CT HEAD FINDINGS Scalp soft tissues demonstrate no acute abnormality. No acute abnormality about the orbits. Paranasal sinuses  are clear.  No mastoid effusion. Calvarium intact. Scattered vascular calcifications within the carotid siphons. Age-related cerebral atrophy with mild  chronic small vessel ischemic disease. No acute intracranial hemorrhage or large vessel territory infarct. No mass lesion, midline shift, or mass effect. No hydrocephalus. No extra-axial fluid collection. CT CERVICAL SPINE FINDINGS Reversal of the normal cervical lordosis with apex at C5. Trace anterolisthesis of C3 on C4 and C4 on C5. Vertebral body heights are preserved. Normal C1-2 articulations are intact. No prevertebral soft tissue swelling. No acute fracture or listhesis. Incomplete fusion of the posterior ring of C1 noted. Moderate to advanced degenerative spondylolysis at C4-5, C5-6, and C6-7. Multilevel facet arthrosis within the upper cervical spine. Visualized soft tissues of the neck are within normal limits. Visualized lung apices are clear without evidence of apical pneumothorax. 4 mm nodule within the right upper lobe (series 7, image 65). IMPRESSION: CT BRAIN: 1. No acute intracranial process. 2. Generalized cerebral atrophy with mild chronic microvascular ischemic disease, stable. CT CERVICAL SPINE: 1. No acute traumatic injury within cervical spine. 2. Reversal of the normal cervical lordosis with advanced degenerative spondylolysis at C4-5, C5-6, and C6-7, stable. 3. 4 mm right upper lobe nodule, indeterminate. No follow-up needed if patient is low-risk. Non-contrast chest CT can be considered in 12 months if patient is high-risk. This recommendation follows the consensus statement: Guidelines for Management of Incidental Pulmonary Nodules Detected on CT Images:From the Fleischner Society 2017; published online before print (10.1148/[id number]). Electronically Signed   By: Jeannine Boga M.D.   On: 10/22/2015 01:42   Dg Chest Port 1 View  10/22/2015  CLINICAL DATA:  Preop for hip fracture. EXAM: PORTABLE CHEST 1 VIEW COMPARISON:  07/02/2014 FINDINGS: Chronic cardiomegaly. Lower mediastinal widening from hiatal hernia, better seen previously. Negative aortic contours. Interstitial  coarsening likely from low volumes. No edema, pneumonia, effusion, or pneumothorax. No appreciable fracture. IMPRESSION: Cardiomegaly without failure. Electronically Signed   By: Monte Fantasia M.D.   On: 10/22/2015 01:01   Dg Hip Unilat With Pelvis 2-3 Views Left  10/22/2015  CLINICAL DATA:  Fall with left hip deformity.  Initial encounter. EXAM: DG HIP (WITH OR WITHOUT PELVIS) 2-3V LEFT COMPARISON:  None. FINDINGS: Acute intertrochanteric left femur fracture with varus angulation from medial impaction. Located hips. No evidence of pelvic ring fracture or diastasis. Osteopenia and atherosclerosis. IMPRESSION: Displaced intertrochanteric left femur fracture. Electronically Signed   By: Monte Fantasia M.D.   On: 10/22/2015 00:34   Dg Femur Min 2 Views Left  10/22/2015  CLINICAL DATA:  Fall with left hip deformity.  Initial encounter. EXAM: LEFT FEMUR 2 VIEWS COMPARISON:  None. FINDINGS: An intertrochanteric femur fracture is described on contemporaneous hip radiography. No distal fracture is seen. Status post total knee arthroplasty with partial visualization of the tibial component. Osteopenia. IMPRESSION: Displaced intertrochanteric left femur fracture. Electronically Signed   By: Monte Fantasia M.D.   On: 10/22/2015 00:39    ROS Blood pressure 178/52, pulse 66, temperature 99 F (37.2 C), temperature source Oral, resp. rate 18, height '4\' 7"'$  (1.397 m), weight 54.432 kg (120 lb), SpO2 100 %. Physical Exam  Patient resting comfortably in bed in NAD, neck non tender, pain free AROM of the neck,. Bilateral UEs with no pain with AROM, no deformity or tenderness, right LE with no pain with AROM, no pain with PROM logroll, NVI L LE, shortened and externally rotated with good perfusion and pulses, wiggles toes, sensation intact  Assessment/Plan: Displaced left intertrochanteric hip fracture in  patient with advanced dementia. Discussed need to perform ORIF of the left hip with daughter who is present and  she agrees. Dr Lyla Glassing has graciously agreed to take over her care and perform her surgery tentatively planned for later today. Maintain NPO.  Medical optimization and clearance.  Stepahnie Campo,STEVEN R 10/22/2015, 7:20 AM

## 2015-10-22 NOTE — Progress Notes (Signed)
PROGRESS NOTE                                                                                                                                                                                                             Patient Demographics:    Laura Lowe, is a 82 y.o. female, DOB - 08/08/1919, ZOX:[id number]  Admit date - 10/21/2015   Admitting Physician Eduard Clos, MD  Outpatient Primary MD for the patient is [name], MD  LOS - 0  Chief Complaint  Patient presents with  . Fall       Brief Narrative  82 y.o Female with a PMH of dementia, HTN, hypothyroidism presented to the ED for a unwitnessed fall and left leg pain. X-ray showing Left hip fracture. CT head and neck unremarkable except for lung nodule. [name] was consulted and plan to have ORIF today.    Subjective:   She is resting comfortable. Daughter states she is at baseline.    Assessment  & Plan :    Displaced left intertrochanteric hip fracture: Secondary to mechanical fall. Ortho has been consulted and plan to have ORIF today. Keep patient NPO. Continue pain medications.   Hypertension uncontrolled: Secondary to pain. Resolved currently. Continue Hydralzine PRN and home dosing of Ramipril.  Leukocytosis: likely reactive, Chest x-ray and UA are clear. UA culture pending, will follow.   Hypothyroidism: Continue Levothyroxine.   Dementia: Currently at baseline per daughter. Continue home medications for dementia.  Anemia: Likely iron deficiency, currently on iron supplementation. VS are stable. Will continue to monitor.  4 mm lung nodule in RUL:  Follow up as outpatient with PCP   Code Status : Do Not Resuscitate  Family Communication  :  Daughter in room  Disposition Plan  :   Stay inpatient- have surgery and stay in hospital a few days to manage then most likely to SNF  Consults  :  Orthopedics  Procedures  :  none  DVT  Prophylaxis  :  SCDs   Lab Results  Component Value Date   PLT 227 10/22/2015    Inpatient Medications  Scheduled Meds: . ferrous sulfate  325 mg Oral Daily  . latanoprost  1 drop Both Eyes QHS  . levothyroxine  50 mcg Oral QAC breakfast  . memantine  10 mg Oral BID  . OLANZapine  5 mg Oral BID  . PARoxetine  10 mg Oral Daily  . ramipril  10 mg Oral Daily   Continuous Infusions: . sodium chloride 50 mL/hr at 10/22/15 0530   PRN Meds:.albuterol, diazepam, fluticasone, hydrALAZINE, HYDROcodone-acetaminophen, morphine injection  Antibiotics  :    Anti-infectives    None         Objective:   Filed Vitals:   10/22/15 0238 10/22/15 0438 10/22/15 0500 10/22/15 0750  BP: 130/57 178/52  144/81  Pulse: 68 66    Temp: 98 F (36.7 C) 99 F (37.2 C)    TempSrc: Oral Oral    Resp: 18 18    Height:   4\' 7"  (1.397 m)   Weight:   54.432 kg (120 lb)   SpO2: 96% 100%      Wt Readings from Last 3 Encounters:  10/22/15 54.432 kg (120 lb)  05/07/15 53.978 kg (119 lb)  10/23/14 64.411 kg (142 lb)     Intake/Output Summary (Last 24 hours) at 10/22/15 1259 Last data filed at 10/22/15 1223  Gross per 24 hour  Intake      0 ml  Output    500 ml  Net   -500 ml     Physical Exam  Awake Alert, Oriented X 2, No new F.N deficits, Normal affect [place].AT,PERRAL Supple Neck,No JVD, No cervical lymphadenopathy appriciated.  Symmetrical Chest wall movement, Good air movement bilaterally, CTAB RRR,No Gallops,Rubs or new Murmurs, No Parasternal Heave +ve B.Sounds, Abd Soft, No tenderness, No organomegaly appriciated, No rebound - guarding or rigidity. No Cyanosis, Clubbing or edema, No new Rash or bruise  Left Leg is shorter and externally rotated    Data Review:    CBC  Recent Labs Lab 10/22/15 0021 10/22/15 0533  WBC 15.2* 13.6*  HGB 10.7* 10.2*  HCT 34.0* 32.6*  PLT 219 227  MCV 93.7 90.8  MCH 29.5 28.4  MCHC 31.5 31.3  RDW 13.5 13.2  LYMPHSABS 1.2 2.2  MONOABS  1.0 1.4*  EOSABS 0.1 0.1  BASOSABS 0.0 0.0    Chemistries   Recent Labs Lab 10/22/15 0021 10/22/15 0533  NA 141 142  K 4.2 4.1  CL 110 109  CO2 25 26  GLUCOSE 127* 125*  BUN 27* 25*  CREATININE 0.92 0.82  CALCIUM 8.7* 8.9  AST  --  20  ALT  --  12*  ALKPHOS  --  57  BILITOT  --  0.6   ------------------------------------------------------------------------------------------------------------------ No results for input(s): CHOL, HDL, LDLCALC, TRIG, CHOLHDL, LDLDIRECT in the last 72 hours.  No results found for: HGBA1C ------------------------------------------------------------------------------------------------------------------ No results for input(s): TSH, T4TOTAL, T3FREE, THYROIDAB in the last 72 hours.  Invalid input(s): FREET3 ------------------------------------------------------------------------------------------------------------------ No results for input(s): VITAMINB12, FOLATE, FERRITIN, TIBC, IRON, RETICCTPCT in the last 72 hours.  Coagulation profile  Recent Labs Lab 10/22/15 0021  INR 1.01    No results for input(s): DDIMER in the last 72 hours.  Cardiac Enzymes No results for input(s): CKMB, TROPONINI, MYOGLOBIN in the last 168 hours.  Invalid input(s): CK ------------------------------------------------------------------------------------------------------------------ No results found for: BNP  Micro Results Recent Results (from the past 240 hour(s))  Surgical pcr screen     Status: None   Collection Time: 10/22/15  5:51 AM  Result Value Ref Range Status   MRSA, PCR NEGATIVE NEGATIVE Final   Staphylococcus aureus NEGATIVE NEGATIVE Final    Comment:        The Xpert SA Assay (FDA approved for NASAL specimens in patients over  80 years of age), is one component of a comprehensive surveillance program.  Test performance has been validated by [facility] for patients greater than or equal to 52 year old. It is not intended to diagnose  infection nor to guide or monitor treatment.     Radiology Reports Dg Elbow Complete Left  10/22/2015  CLINICAL DATA:  Fall with hematoma to the left elbow. Initial encounter. EXAM: LEFT ELBOW - COMPLETE 3+ VIEW COMPARISON:  None. FINDINGS: Soft tissue swelling over the olecranon process correlating with history. No joint effusion, fracture, or dislocation. IMPRESSION: Soft tissue swelling without acute osseous finding. Electronically Signed   By: Marnee Spring M.D.   On: 10/22/2015 00:35   Dg Forearm Left  10/22/2015  CLINICAL DATA:  Fall with swelling to the olecranon process. Initial encounter. EXAM: LEFT FOREARM - 2 VIEW COMPARISON:  08/20/2010 wrist radiography FINDINGS: Soft tissue swelling over the olecranon process. Negative for acute fracture or dislocation. There is a remote distal radius fracture with healed malalignment. Remote ulnar styloid process fracture. Osteopenia. First CMC osteoarthritis. IMPRESSION: 1. No acute osseous finding. 2. Remote distal radius and ulnar fractures. Electronically Signed   By: Marnee Spring M.D.   On: 10/22/2015 00:37   Dg Knee 1-2 Views Left  10/22/2015  CLINICAL DATA:  Initial evaluation for acute trauma, fall. EXAM: LEFT KNEE - 1-2 VIEW COMPARISON:  None. FINDINGS: Cemented total knee arthroplasty in place. No periprosthetic fracture. No periprosthetic lucency to suggest loosening or infection. No other acute osseous abnormality about the knee. Osteopenia. No soft tissue abnormality. IMPRESSION: 1. No acute fracture or dislocation. 2. Left total knee arthroplasty in place without complication. 3. Osteopenia. Electronically Signed   By: Rise Mu M.D.   On: 10/22/2015 00:40   Ct Head Wo Contrast  10/22/2015  CLINICAL DATA:  Initial evaluation for acute trauma, unwitnessed fall. EXAM: CT HEAD WITHOUT CONTRAST CT CERVICAL SPINE WITHOUT CONTRAST TECHNIQUE: Multidetector CT imaging of the head and cervical spine was performed following the standard  protocol without intravenous contrast. Multiplanar CT image reconstructions of the cervical spine were also generated. COMPARISON:  Prior CT from 08/20/2010. FINDINGS: CT HEAD FINDINGS Scalp soft tissues demonstrate no acute abnormality. No acute abnormality about the orbits. Paranasal sinuses are clear.  No mastoid effusion. Calvarium intact. Scattered vascular calcifications within the carotid siphons. Age-related cerebral atrophy with mild chronic small vessel ischemic disease. No acute intracranial hemorrhage or large vessel territory infarct. No mass lesion, midline shift, or mass effect. No hydrocephalus. No extra-axial fluid collection. CT CERVICAL SPINE FINDINGS Reversal of the normal cervical lordosis with apex at C5. Trace anterolisthesis of C3 on C4 and C4 on C5. Vertebral body heights are preserved. Normal C1-2 articulations are intact. No prevertebral soft tissue swelling. No acute fracture or listhesis. Incomplete fusion of the posterior ring of C1 noted. Moderate to advanced degenerative spondylolysis at C4-5, C5-6, and C6-7. Multilevel facet arthrosis within the upper cervical spine. Visualized soft tissues of the neck are within normal limits. Visualized lung apices are clear without evidence of apical pneumothorax. 4 mm nodule within the right upper lobe (series 7, image 65). IMPRESSION: CT BRAIN: 1. No acute intracranial process. 2. Generalized cerebral atrophy with mild chronic microvascular ischemic disease, stable. CT CERVICAL SPINE: 1. No acute traumatic injury within cervical spine. 2. Reversal of the normal cervical lordosis with advanced degenerative spondylolysis at C4-5, C5-6, and C6-7, stable. 3. 4 mm right upper lobe nodule, indeterminate. No follow-up needed if patient is low-risk. Non-contrast  chest CT can be considered in 12 months if patient is high-risk. This recommendation follows the consensus statement: Guidelines for Management of Incidental Pulmonary Nodules Detected on CT  Images:From the Fleischner Society 2017; published online before print (10.1148/[id number]). Electronically Signed   By: Rise MuBenjamin  McClintock M.D.   On: 10/22/2015 01:42   Ct Cervical Spine Wo Contrast  10/22/2015  CLINICAL DATA:  Initial evaluation for acute trauma, unwitnessed fall. EXAM: CT HEAD WITHOUT CONTRAST CT CERVICAL SPINE WITHOUT CONTRAST TECHNIQUE: Multidetector CT imaging of the head and cervical spine was performed following the standard protocol without intravenous contrast. Multiplanar CT image reconstructions of the cervical spine were also generated. COMPARISON:  Prior CT from 08/20/2010. FINDINGS: CT HEAD FINDINGS Scalp soft tissues demonstrate no acute abnormality. No acute abnormality about the orbits. Paranasal sinuses are clear.  No mastoid effusion. Calvarium intact. Scattered vascular calcifications within the carotid siphons. Age-related cerebral atrophy with mild chronic small vessel ischemic disease. No acute intracranial hemorrhage or large vessel territory infarct. No mass lesion, midline shift, or mass effect. No hydrocephalus. No extra-axial fluid collection. CT CERVICAL SPINE FINDINGS Reversal of the normal cervical lordosis with apex at C5. Trace anterolisthesis of C3 on C4 and C4 on C5. Vertebral body heights are preserved. Normal C1-2 articulations are intact. No prevertebral soft tissue swelling. No acute fracture or listhesis. Incomplete fusion of the posterior ring of C1 noted. Moderate to advanced degenerative spondylolysis at C4-5, C5-6, and C6-7. Multilevel facet arthrosis within the upper cervical spine. Visualized soft tissues of the neck are within normal limits. Visualized lung apices are clear without evidence of apical pneumothorax. 4 mm nodule within the right upper lobe (series 7, image 65). IMPRESSION: CT BRAIN: 1. No acute intracranial process. 2. Generalized cerebral atrophy with mild chronic microvascular ischemic disease, stable. CT CERVICAL SPINE: 1.  No acute traumatic injury within cervical spine. 2. Reversal of the normal cervical lordosis with advanced degenerative spondylolysis at C4-5, C5-6, and C6-7, stable. 3. 4 mm right upper lobe nodule, indeterminate. No follow-up needed if patient is low-risk. Non-contrast chest CT can be considered in 12 months if patient is high-risk. This recommendation follows the consensus statement: Guidelines for Management of Incidental Pulmonary Nodules Detected on CT Images:From the Fleischner Society 2017; published online before print (10.1148/[id number]). Electronically Signed   By: Rise MuBenjamin  McClintock M.D.   On: 10/22/2015 01:42   Dg Chest Port 1 View  10/22/2015  CLINICAL DATA:  Preop for hip fracture. EXAM: PORTABLE CHEST 1 VIEW COMPARISON:  07/02/2014 FINDINGS: Chronic cardiomegaly. Lower mediastinal widening from hiatal hernia, better seen previously. Negative aortic contours. Interstitial coarsening likely from low volumes. No edema, pneumonia, effusion, or pneumothorax. No appreciable fracture. IMPRESSION: Cardiomegaly without failure. Electronically Signed   By: Marnee SpringJonathon  Watts M.D.   On: 10/22/2015 01:01   Dg Knee Left Port  10/22/2015  CLINICAL DATA:  Pain following fall EXAM: PORTABLE LEFT KNEE - 1-2 VIEW COMPARISON:  October 21, 2015 FINDINGS: Frontal and lateral views were obtained. There is a total knee replacement with femoral and tibial prosthetic components appearing well-seated. No acute fracture or dislocation evident. No joint effusion. No erosive change. There is arterial vascular calcification in the superficial femoral artery. IMPRESSION: Prosthetic components appear well seated. No fracture or dislocation. No joint effusion. There is arterial vascular calcification present. Electronically Signed   By: Bretta BangWilliam  Woodruff III M.D.   On: 10/22/2015 08:22   Dg Hip Unilat With Pelvis 2-3 Views Left  10/22/2015  CLINICAL DATA:  Fall with  left hip deformity.  Initial encounter. EXAM: DG HIP (WITH  OR WITHOUT PELVIS) 2-3V LEFT COMPARISON:  None. FINDINGS: Acute intertrochanteric left femur fracture with varus angulation from medial impaction. Located hips. No evidence of pelvic ring fracture or diastasis. Osteopenia and atherosclerosis. IMPRESSION: Displaced intertrochanteric left femur fracture. Electronically Signed   By: Marnee Spring M.D.   On: 10/22/2015 00:34   Dg Femur Min 2 Views Left  10/22/2015  CLINICAL DATA:  Fall with left hip deformity.  Initial encounter. EXAM: LEFT FEMUR 2 VIEWS COMPARISON:  None. FINDINGS: An intertrochanteric femur fracture is described on contemporaneous hip radiography. No distal fracture is seen. Status post total knee arthroplasty with partial visualization of the tibial component. Osteopenia. IMPRESSION: Displaced intertrochanteric left femur fracture. Electronically Signed   By: Marnee Spring M.D.   On: 10/22/2015 00:39    Time Spent in minutes  30  Debbra Riding PA-S on 10/22/2015 at 12:59 PM  Attending MD note  Patient was seen, examined,treatment plan was discussed with the PA-S.  I have personally reviewed the clinical findings, lab, imaging studies and management of this patient in detail. I agree with the documentation, as recorded by the PA-S.   Patient is pleasantly confused, per daughter she is close to baseline. Patient sustained a mechanical fall and now has a left hip fracture. Scheduled for operative repair later today.   On Exam: Gen. exam: Awake, alert, not in any distress Chest: Good air entry bilaterally, no rhonchi or rales CVS: S1-S2 regular, no murmurs Abdomen: Soft, nontender and nondistended Neurology: Non-focal Skin: No rash or lesions  Plan Continue supportive care Also planning on operative repair later today Daughter aware of the risks of the proposed surgical procedure-she is accepting and willing to proceed DO NOT RESUSCITATE reconfirmed this morning.  Rest as above  [facility]

## 2015-10-22 NOTE — Transfer of Care (Signed)
Immediate Anesthesia Transfer of Care Note  Patient: Laura Lowe  Procedure(s) Performed: Procedure(s): INTRAMEDULLARY (IM) NAIL FEMORAL LEFT (Left)  Patient Location: PACU  Anesthesia Type:MAC and Spinal  Level of Consciousness:  sedated, patient cooperative and responds to stimulation  Airway & Oxygen Therapy:Patient Spontanous Breathing and Patient connected to face mask oxgen  Post-op Assessment:  Report given to PACU RN and Post -op Vital signs reviewed and stable  Post vital signs:  Reviewed and stable  Last Vitals:  Filed Vitals:   10/22/15 0750 10/22/15 1400  BP: 144/81 161/61  Pulse:  79  Temp:  36.9 C  Resp:  18    Complications: No apparent anesthesia complications

## 2015-10-22 NOTE — ED Notes (Signed)
Was advised by 6th floor staff that nurse has not arrived that will be taking report on patient and that they would call back when nurse arrived.

## 2015-10-22 NOTE — H&P (View-Only) (Signed)
Reason for Consult: Broken left hip Referring Physician: [name] is an 82 y.o. female.  HPI: 82 yo female with dementia who fell yesterday injuring her left hip.  Patient unable to stand or bear weight on the left leg after the fall.  Patient presented to [facility] ED for eval and treatment.  She is currently complaining of left hip pain but denies other injuries other than some bruising on her left elbow.  Past Medical History  Diagnosis Date  . Glaucoma   . Arthritis   . Hypertension   . Osteoporosis   . Allergy   . Counseling for estrogen replacement therapy   . IBS (irritable bowel syndrome)   . Anxiety   . Depression   . HSV-1 (herpes simplex virus 1) infection   . Carotid bruit   . Hearing loss   . Vitamin D deficiency   . Dementia     Past Surgical History  Procedure Laterality Date  . Abdominal hysterectomy    . Rotator cuff repair    . Eye surgery      cataract OD & OS  . Joint replacement Left     Knee    Family History  Problem Relation Age of Onset  . Cancer Mother   . Heart disease Mother   . Hypertension Mother   . Stroke Father     Social History:  reports that she has never smoked. She has never used smokeless tobacco. She reports that she does not drink alcohol or use illicit drugs.  Allergies:  Allergies  Allergen Reactions  . Risedronate Sodium Other (See Comments)    GI    Medications: I have reviewed the patient's current medications.  Results for orders placed or performed during the hospital encounter of 10/21/15 (from the past 48 hour(s))  Basic metabolic panel     Status: Abnormal   Collection Time: 10/22/15 12:21 AM  Result Value Ref Range   Sodium 141 135 - 145 mmol/L   Potassium 4.2 3.5 - 5.1 mmol/L   Chloride 110 101 - 111 mmol/L   CO2 25 22 - 32 mmol/L   Glucose, Bld 127 (H) 65 - 99 mg/dL   BUN 27 (H) 6 - 20 mg/dL   Creatinine, Ser 8.10 0.44 - 1.00 mg/dL   Calcium 8.7 (L) 8.9 - 10.3 mg/dL   GFR calc non Af  Amer 52 (L) >60 mL/min   GFR calc Af Amer >60 >60 mL/min    Comment: (NOTE) The eGFR has been calculated using the CKD EPI equation. This calculation has not been validated in all clinical situations. eGFR's persistently <60 mL/min signify possible Chronic Kidney Disease.    Anion gap 6 5 - 15  CBC with Differential     Status: Abnormal   Collection Time: 10/22/15 12:21 AM  Result Value Ref Range   WBC 15.2 (H) 4.0 - 10.5 K/uL   RBC 3.63 (L) 3.87 - 5.11 MIL/uL   Hemoglobin 10.7 (L) 12.0 - 15.0 g/dL   HCT 84.3 (L) 68.9 - 05.6 %   MCV 93.7 78.0 - 100.0 fL   MCH 29.5 26.0 - 34.0 pg   MCHC 31.5 30.0 - 36.0 g/dL   RDW 52.5 37.3 - 31.3 %   Platelets 219 150 - 400 K/uL   Neutrophils Relative % 84 %   Neutro Abs 12.9 (H) 1.7 - 7.7 K/uL   Lymphocytes Relative 8 %   Lymphs Abs 1.2 0.7 - 4.0 K/uL   Monocytes  Relative 7 %   Monocytes Absolute 1.0 0.1 - 1.0 K/uL   Eosinophils Relative 1 %   Eosinophils Absolute 0.1 0.0 - 0.7 K/uL   Basophils Relative 0 %   Basophils Absolute 0.0 0.0 - 0.1 K/uL  Protime-INR     Status: None   Collection Time: 10/22/15 12:21 AM  Result Value Ref Range   Prothrombin Time 13.5 11.6 - 15.2 seconds   INR 1.01 0.00 - 1.49  Type and screen  [facility]     Status: None (Preliminary result)   Collection Time: 10/22/15  5:23 AM  Result Value Ref Range   ABO/RH(D) O POS    Antibody Screen PENDING    Sample Expiration 10/25/2015   CBC WITH DIFFERENTIAL     Status: Abnormal   Collection Time: 10/22/15  5:33 AM  Result Value Ref Range   WBC 13.6 (H) 4.0 - 10.5 K/uL   RBC 3.59 (L) 3.87 - 5.11 MIL/uL   Hemoglobin 10.2 (L) 12.0 - 15.0 g/dL   HCT 09.1 (L) 04.1 - 07.3 %   MCV 90.8 78.0 - 100.0 fL   MCH 28.4 26.0 - 34.0 pg   MCHC 31.3 30.0 - 36.0 g/dL   RDW 12.1 24.4 - 10.7 %   Platelets 227 150 - 400 K/uL   Neutrophils Relative % 73 %   Neutro Abs 10.0 (H) 1.7 - 7.7 K/uL   Lymphocytes Relative 16 %   Lymphs Abs 2.2 0.7 - 4.0 K/uL    Monocytes Relative 10 %   Monocytes Absolute 1.4 (H) 0.1 - 1.0 K/uL   Eosinophils Relative 1 %   Eosinophils Absolute 0.1 0.0 - 0.7 K/uL   Basophils Relative 0 %   Basophils Absolute 0.0 0.0 - 0.1 K/uL  Comprehensive metabolic panel     Status: Abnormal   Collection Time: 10/22/15  5:33 AM  Result Value Ref Range   Sodium 142 135 - 145 mmol/L   Potassium 4.1 3.5 - 5.1 mmol/L   Chloride 109 101 - 111 mmol/L   CO2 26 22 - 32 mmol/L   Glucose, Bld 125 (H) 65 - 99 mg/dL   BUN 25 (H) 6 - 20 mg/dL   Creatinine, Ser 7.85 0.44 - 1.00 mg/dL   Calcium 8.9 8.9 - 40.4 mg/dL   Total Protein 5.9 (L) 6.5 - 8.1 g/dL   Albumin 3.5 3.5 - 5.0 g/dL   AST 20 15 - 41 U/L   ALT 12 (L) 14 - 54 U/L   Alkaline Phosphatase 57 38 - 126 U/L   Total Bilirubin 0.6 0.3 - 1.2 mg/dL   GFR calc non Af Amer 60 (L) >60 mL/min   GFR calc Af Amer >60 >60 mL/min    Comment: (NOTE) The eGFR has been calculated using the CKD EPI equation. This calculation has not been validated in all clinical situations. eGFR's persistently <60 mL/min signify possible Chronic Kidney Disease.    Anion gap 7 5 - 15    Dg Elbow Complete Left  10/22/2015  CLINICAL DATA:  Fall with hematoma to the left elbow. Initial encounter. EXAM: LEFT ELBOW - COMPLETE 3+ VIEW COMPARISON:  None. FINDINGS: Soft tissue swelling over the olecranon process correlating with history. No joint effusion, fracture, or dislocation. IMPRESSION: Soft tissue swelling without acute osseous finding. Electronically Signed   By: Marnee Spring M.D.   On: 10/22/2015 00:35   Dg Forearm Left  10/22/2015  CLINICAL DATA:  Fall with swelling to the olecranon process. Initial  encounter. EXAM: LEFT FOREARM - 2 VIEW COMPARISON:  08/20/2010 wrist radiography FINDINGS: Soft tissue swelling over the olecranon process. Negative for acute fracture or dislocation. There is a remote distal radius fracture with healed malalignment. Remote ulnar styloid process fracture. Osteopenia. First  CMC osteoarthritis. IMPRESSION: 1. No acute osseous finding. 2. Remote distal radius and ulnar fractures. Electronically Signed   By: Monte Fantasia M.D.   On: 10/22/2015 00:37   Dg Knee 1-2 Views Left  10/22/2015  CLINICAL DATA:  Initial evaluation for acute trauma, fall. EXAM: LEFT KNEE - 1-2 VIEW COMPARISON:  None. FINDINGS: Cemented total knee arthroplasty in place. No periprosthetic fracture. No periprosthetic lucency to suggest loosening or infection. No other acute osseous abnormality about the knee. Osteopenia. No soft tissue abnormality. IMPRESSION: 1. No acute fracture or dislocation. 2. Left total knee arthroplasty in place without complication. 3. Osteopenia. Electronically Signed   By: Jeannine Boga M.D.   On: 10/22/2015 00:40   Ct Head Wo Contrast  10/22/2015  CLINICAL DATA:  Initial evaluation for acute trauma, unwitnessed fall. EXAM: CT HEAD WITHOUT CONTRAST CT CERVICAL SPINE WITHOUT CONTRAST TECHNIQUE: Multidetector CT imaging of the head and cervical spine was performed following the standard protocol without intravenous contrast. Multiplanar CT image reconstructions of the cervical spine were also generated. COMPARISON:  Prior CT from 08/20/2010. FINDINGS: CT HEAD FINDINGS Scalp soft tissues demonstrate no acute abnormality. No acute abnormality about the orbits. Paranasal sinuses are clear.  No mastoid effusion. Calvarium intact. Scattered vascular calcifications within the carotid siphons. Age-related cerebral atrophy with mild chronic small vessel ischemic disease. No acute intracranial hemorrhage or large vessel territory infarct. No mass lesion, midline shift, or mass effect. No hydrocephalus. No extra-axial fluid collection. CT CERVICAL SPINE FINDINGS Reversal of the normal cervical lordosis with apex at C5. Trace anterolisthesis of C3 on C4 and C4 on C5. Vertebral body heights are preserved. Normal C1-2 articulations are intact. No prevertebral soft tissue swelling. No acute  fracture or listhesis. Incomplete fusion of the posterior ring of C1 noted. Moderate to advanced degenerative spondylolysis at C4-5, C5-6, and C6-7. Multilevel facet arthrosis within the upper cervical spine. Visualized soft tissues of the neck are within normal limits. Visualized lung apices are clear without evidence of apical pneumothorax. 4 mm nodule within the right upper lobe (series 7, image 65). IMPRESSION: CT BRAIN: 1. No acute intracranial process. 2. Generalized cerebral atrophy with mild chronic microvascular ischemic disease, stable. CT CERVICAL SPINE: 1. No acute traumatic injury within cervical spine. 2. Reversal of the normal cervical lordosis with advanced degenerative spondylolysis at C4-5, C5-6, and C6-7, stable. 3. 4 mm right upper lobe nodule, indeterminate. No follow-up needed if patient is low-risk. Non-contrast chest CT can be considered in 12 months if patient is high-risk. This recommendation follows the consensus statement: Guidelines for Management of Incidental Pulmonary Nodules Detected on CT Images:From the Fleischner Society 2017; published online before print (10.1148/[id number]). Electronically Signed   By: Jeannine Boga M.D.   On: 10/22/2015 01:42   Ct Cervical Spine Wo Contrast  10/22/2015  CLINICAL DATA:  Initial evaluation for acute trauma, unwitnessed fall. EXAM: CT HEAD WITHOUT CONTRAST CT CERVICAL SPINE WITHOUT CONTRAST TECHNIQUE: Multidetector CT imaging of the head and cervical spine was performed following the standard protocol without intravenous contrast. Multiplanar CT image reconstructions of the cervical spine were also generated. COMPARISON:  Prior CT from 08/20/2010. FINDINGS: CT HEAD FINDINGS Scalp soft tissues demonstrate no acute abnormality. No acute abnormality about the orbits. Paranasal sinuses  are clear.  No mastoid effusion. Calvarium intact. Scattered vascular calcifications within the carotid siphons. Age-related cerebral atrophy with mild  chronic small vessel ischemic disease. No acute intracranial hemorrhage or large vessel territory infarct. No mass lesion, midline shift, or mass effect. No hydrocephalus. No extra-axial fluid collection. CT CERVICAL SPINE FINDINGS Reversal of the normal cervical lordosis with apex at C5. Trace anterolisthesis of C3 on C4 and C4 on C5. Vertebral body heights are preserved. Normal C1-2 articulations are intact. No prevertebral soft tissue swelling. No acute fracture or listhesis. Incomplete fusion of the posterior ring of C1 noted. Moderate to advanced degenerative spondylolysis at C4-5, C5-6, and C6-7. Multilevel facet arthrosis within the upper cervical spine. Visualized soft tissues of the neck are within normal limits. Visualized lung apices are clear without evidence of apical pneumothorax. 4 mm nodule within the right upper lobe (series 7, image 65). IMPRESSION: CT BRAIN: 1. No acute intracranial process. 2. Generalized cerebral atrophy with mild chronic microvascular ischemic disease, stable. CT CERVICAL SPINE: 1. No acute traumatic injury within cervical spine. 2. Reversal of the normal cervical lordosis with advanced degenerative spondylolysis at C4-5, C5-6, and C6-7, stable. 3. 4 mm right upper lobe nodule, indeterminate. No follow-up needed if patient is low-risk. Non-contrast chest CT can be considered in 12 months if patient is high-risk. This recommendation follows the consensus statement: Guidelines for Management of Incidental Pulmonary Nodules Detected on CT Images:From the Fleischner Society 2017; published online before print (10.1148/[id number]). Electronically Signed   By: Jeannine Boga M.D.   On: 10/22/2015 01:42   Dg Chest Port 1 View  10/22/2015  CLINICAL DATA:  Preop for hip fracture. EXAM: PORTABLE CHEST 1 VIEW COMPARISON:  07/02/2014 FINDINGS: Chronic cardiomegaly. Lower mediastinal widening from hiatal hernia, better seen previously. Negative aortic contours. Interstitial  coarsening likely from low volumes. No edema, pneumonia, effusion, or pneumothorax. No appreciable fracture. IMPRESSION: Cardiomegaly without failure. Electronically Signed   By: Monte Fantasia M.D.   On: 10/22/2015 01:01   Dg Hip Unilat With Pelvis 2-3 Views Left  10/22/2015  CLINICAL DATA:  Fall with left hip deformity.  Initial encounter. EXAM: DG HIP (WITH OR WITHOUT PELVIS) 2-3V LEFT COMPARISON:  None. FINDINGS: Acute intertrochanteric left femur fracture with varus angulation from medial impaction. Located hips. No evidence of pelvic ring fracture or diastasis. Osteopenia and atherosclerosis. IMPRESSION: Displaced intertrochanteric left femur fracture. Electronically Signed   By: Monte Fantasia M.D.   On: 10/22/2015 00:34   Dg Femur Min 2 Views Left  10/22/2015  CLINICAL DATA:  Fall with left hip deformity.  Initial encounter. EXAM: LEFT FEMUR 2 VIEWS COMPARISON:  None. FINDINGS: An intertrochanteric femur fracture is described on contemporaneous hip radiography. No distal fracture is seen. Status post total knee arthroplasty with partial visualization of the tibial component. Osteopenia. IMPRESSION: Displaced intertrochanteric left femur fracture. Electronically Signed   By: Monte Fantasia M.D.   On: 10/22/2015 00:39    ROS Blood pressure 178/52, pulse 66, temperature 99 F (37.2 C), temperature source Oral, resp. rate 18, height '4\' 7"'$  (1.397 m), weight 54.432 kg (120 lb), SpO2 100 %. Physical Exam  Patient resting comfortably in bed in NAD, neck non tender, pain free AROM of the neck,. Bilateral UEs with no pain with AROM, no deformity or tenderness, right LE with no pain with AROM, no pain with PROM logroll, NVI L LE, shortened and externally rotated with good perfusion and pulses, wiggles toes, sensation intact  Assessment/Plan: Displaced left intertrochanteric hip fracture in  patient with advanced dementia. Discussed need to perform ORIF of the left hip with daughter who is present and  she agrees. Dr Lyla Glassing has graciously agreed to take over her care and perform her surgery tentatively planned for later today. Maintain NPO.  Medical optimization and clearance.  Kyren Knick,STEVEN R 10/22/2015, 7:20 AM

## 2015-10-22 NOTE — Anesthesia Procedure Notes (Signed)
Spinal Patient location during procedure: OR Staffing Anesthesiologist: Jaquon Gingerich Preanesthetic Checklist Completed: patient identified, surgical consent, pre-op evaluation, timeout performed, IV checked, risks and benefits discussed and monitors and equipment checked Spinal Block Patient position: left lateral decubitus Prep: site prepped and draped and DuraPrep Patient monitoring: heart rate, cardiac monitor, continuous pulse ox and blood pressure Approach: midline Location: L3-4 Injection technique: single-shot Needle Needle type: Sprotte  Needle gauge: 24 G Needle length: 10 cm Assessment Sensory level: T6

## 2015-10-22 NOTE — Interval H&P Note (Signed)
History and Physical Interval Note:  10/22/2015 8:34 PM  Edman Circleharlotte A Bufkin  has presented today for surgery, with the diagnosis of LEFT INTERTROCHANTERIC FRACTURE  The various methods of treatment have been discussed with the patient and family. After consideration of risks, benefits and other options for treatment, the patient has consented to  Procedure(s): INTRAMEDULLARY (IM) NAIL FEMORAL LEFT (Left) as a surgical intervention .  The patient's history has been reviewed, patient examined, no change in status, stable for surgery.  I have reviewed the patient's chart and labs.  Questions were answered to the patient's satisfaction.     The risks, benefits, and alternatives were discussed with the patient and her family. There are risks associated with the surgery including, but not limited to, problems with anesthesia (death), infection, differences in leg length/angulation/rotation, fracture of bones, loosening or failure of implants, malunion, nonunion, hematoma (blood accumulation) which may require surgical drainage, blood clots, pulmonary embolism, nerve injury (foot drop), and blood vessel injury. The patient and her family understand these risks and elect to proceed.    Jacqualyn Sedgwick, Cloyde ReamsBrian James

## 2015-10-22 NOTE — ED Notes (Signed)
Pt in radiology at this time. 

## 2015-10-22 NOTE — Brief Op Note (Signed)
10/22/2015  10:58 PM  PATIENT:  Laura Lowe  82 y.o. female  PRE-OPERATIVE DIAGNOSIS:  LEFT INTERTROCHANTERIC FRACTURE  POST-OPERATIVE DIAGNOSIS:  LEFT INTERTROCHANTERIC  FRACTURE  PROCEDURE:  Procedure(s): INTRAMEDULLARY (IM) NAIL FEMORAL LEFT (Left)  SURGEON:  Surgeon(s) and Role:    * Samson FredericBrian Tyquavious Gamel, MD - Primary  PHYSICIAN ASSISTANT: Jackie Bissel, PA-C  ASSISTANTS: none   ANESTHESIA:   spinal  EBL:  Total I/O In: 1000 [I.V.:1000] Out: 100 [Blood:100]  BLOOD ADMINISTERED:none  DRAINS: none   LOCAL MEDICATIONS USED:  NONE  SPECIMEN:  No Specimen  DISPOSITION OF SPECIMEN:  N/A  COUNTS:  YES  TOURNIQUET:  * No tourniquets in log *  DICTATION: .Other Dictation: Dictation Number [id number]  PLAN OF CARE: Admit to inpatient   PATIENT DISPOSITION:  PACU - hemodynamically stable.   Delay start of Pharmacological VTE agent (>24hrs) due to surgical blood loss or risk of bleeding: no

## 2015-10-23 DIAGNOSIS — E039 Hypothyroidism, unspecified: Secondary | ICD-10-CM

## 2015-10-23 DIAGNOSIS — S72142A Displaced intertrochanteric fracture of left femur, initial encounter for closed fracture: Secondary | ICD-10-CM | POA: Diagnosis present

## 2015-10-23 DIAGNOSIS — F0390 Unspecified dementia without behavioral disturbance: Secondary | ICD-10-CM

## 2015-10-23 DIAGNOSIS — D62 Acute posthemorrhagic anemia: Secondary | ICD-10-CM

## 2015-10-23 LAB — BASIC METABOLIC PANEL
ANION GAP: 5 (ref 5–15)
BUN: 16 mg/dL (ref 6–20)
CHLORIDE: 109 mmol/L (ref 101–111)
CO2: 26 mmol/L (ref 22–32)
CREATININE: 0.75 mg/dL (ref 0.44–1.00)
Calcium: 8 mg/dL — ABNORMAL LOW (ref 8.9–10.3)
GFR calc Af Amer: >60 mL/min (ref >60)
GFR calc non Af Amer: >60 mL/min (ref >60)
Glucose, Bld: 123 mg/dL — ABNORMAL HIGH (ref 65–99)
POTASSIUM: 3.7 mmol/L (ref 3.5–5.1)
Sodium: 140 mmol/L (ref 135–145)

## 2015-10-23 LAB — CBC
HEMATOCRIT: 25.6 % — AB (ref 36.0–46.0)
Hemoglobin: 8.1 g/dL — ABNORMAL LOW (ref 12.0–15.0)
MCH: 29.5 pg (ref 26.0–34.0)
MCHC: 31.6 g/dL (ref 30.0–36.0)
MCV: 93.1 fL (ref 78.0–100.0)
PLATELETS: 172 10*3/uL (ref 150–400)
RBC: 2.75 MIL/uL — AB (ref 3.87–5.11)
RDW: 13.6 % (ref 11.5–15.5)
WBC: 12.6 10*3/uL — AB (ref 4.0–10.5)

## 2015-10-23 LAB — HEMOGLOBIN AND HEMATOCRIT, BLOOD
HCT: 29.4 % — ABNORMAL LOW (ref 36.0–46.0)
Hemoglobin: 9.6 g/dL — ABNORMAL LOW (ref 12.0–15.0)

## 2015-10-23 LAB — URINE CULTURE: CULTURE: NO GROWTH

## 2015-10-23 LAB — PREPARE RBC (CROSSMATCH)

## 2015-10-23 LAB — GLUCOSE, CAPILLARY
GLUCOSE-CAPILLARY: 106 mg/dL — AB (ref 65–99)
GLUCOSE-CAPILLARY: 118 mg/dL — AB (ref 65–99)
GLUCOSE-CAPILLARY: 128 mg/dL — AB (ref 65–99)
Glucose-Capillary: 131 mg/dL — ABNORMAL HIGH (ref 65–99)

## 2015-10-23 MED ORDER — CEFAZOLIN SODIUM-DEXTROSE 2-4 GM/100ML-% IV SOLN
2.0000 g | Freq: Four times a day (QID) | INTRAVENOUS | Status: AC
  Administered 2015-10-23 (×2): 2 g via INTRAVENOUS
  Filled 2015-10-23 (×2): qty 100

## 2015-10-23 MED ORDER — METOCLOPRAMIDE HCL 5 MG PO TABS: 5.0000 mg | ORAL_TABLET | Freq: Three times a day (TID) | ORAL | Status: DC | PRN

## 2015-10-23 MED ORDER — ONDANSETRON HCL 4 MG/2ML IJ SOLN: 4.0000 mg | Freq: Four times a day (QID) | INTRAMUSCULAR | Status: DC | PRN

## 2015-10-23 MED ORDER — MENTHOL 3 MG MT LOZG: 1.0000 | LOZENGE | OROMUCOSAL | Status: DC | PRN

## 2015-10-23 MED ORDER — SENNA 8.6 MG PO TABS
1.0000 | ORAL_TABLET | Freq: Two times a day (BID) | ORAL | Status: DC
  Administered 2015-10-23 – 2015-10-25 (×5): 8.6 mg via ORAL
  Filled 2015-10-23 (×5): qty 1

## 2015-10-23 MED ORDER — ENOXAPARIN SODIUM 40 MG/0.4ML ~~LOC~~ SOLN
40.0000 mg | SUBCUTANEOUS | Status: DC
  Administered 2015-10-23 – 2015-10-25 (×3): 40 mg via SUBCUTANEOUS
  Filled 2015-10-23 (×3): qty 0.4

## 2015-10-23 MED ORDER — ACETAMINOPHEN 325 MG PO TABS
650.0000 mg | ORAL_TABLET | Freq: Once | ORAL | Status: AC
  Administered 2015-10-23: 650 mg via ORAL
  Filled 2015-10-23: qty 2

## 2015-10-23 MED ORDER — METOCLOPRAMIDE HCL 5 MG/ML IJ SOLN: 5.0000 mg | Freq: Three times a day (TID) | INTRAMUSCULAR | Status: DC | PRN

## 2015-10-23 MED ORDER — ACETAMINOPHEN 325 MG PO TABS
650.0000 mg | ORAL_TABLET | Freq: Four times a day (QID) | ORAL | Status: DC | PRN
  Administered 2015-10-23: 650 mg via ORAL
  Filled 2015-10-23: qty 2

## 2015-10-23 MED ORDER — ONDANSETRON HCL 4 MG PO TABS: 4.0000 mg | ORAL_TABLET | Freq: Four times a day (QID) | ORAL | Status: DC | PRN

## 2015-10-23 MED ORDER — ACETAMINOPHEN 500 MG PO TABS
1000.0000 mg | ORAL_TABLET | Freq: Three times a day (TID) | ORAL | Status: DC
  Administered 2015-10-23 – 2015-10-25 (×4): 1000 mg via ORAL
  Filled 2015-10-23 (×4): qty 2

## 2015-10-23 MED ORDER — ACETAMINOPHEN 650 MG RE SUPP: 650.0000 mg | Freq: Four times a day (QID) | RECTAL | Status: DC | PRN

## 2015-10-23 MED ORDER — OXYCODONE HCL 5 MG PO TABS: 5.0000 mg | ORAL_TABLET | ORAL | Status: DC | PRN

## 2015-10-23 MED ORDER — SODIUM CHLORIDE 0.9 % IV SOLN
Freq: Once | INTRAVENOUS | Status: AC
  Administered 2015-10-23: 10:00:00 via INTRAVENOUS

## 2015-10-23 MED ORDER — DOCUSATE SODIUM 100 MG PO CAPS
100.0000 mg | ORAL_CAPSULE | Freq: Two times a day (BID) | ORAL | Status: DC
  Administered 2015-10-23 – 2015-10-25 (×5): 100 mg via ORAL
  Filled 2015-10-23 (×5): qty 1

## 2015-10-23 MED ORDER — DIPHENHYDRAMINE HCL 50 MG/ML IJ SOLN
25.0000 mg | Freq: Once | INTRAMUSCULAR | Status: AC
  Administered 2015-10-23: 25 mg via INTRAVENOUS
  Filled 2015-10-23: qty 1

## 2015-10-23 MED ORDER — PHENOL 1.4 % MT LIQD
1.0000 | OROMUCOSAL | Status: DC | PRN
  Filled 2015-10-23: qty 177

## 2015-10-23 MED ORDER — FUROSEMIDE 10 MG/ML IJ SOLN
20.0000 mg | Freq: Once | INTRAMUSCULAR | Status: AC
  Administered 2015-10-23: 20 mg via INTRAVENOUS
  Filled 2015-10-23: qty 2

## 2015-10-23 MED ORDER — POLYETHYLENE GLYCOL 3350 17 G PO PACK
17.0000 g | PACK | Freq: Every day | ORAL | Status: DC
  Administered 2015-10-24 – 2015-10-25 (×2): 17 g via ORAL
  Filled 2015-10-23 (×3): qty 1

## 2015-10-23 NOTE — Op Note (Signed)
[name], [name]           ACCOUNT NO.:  [id number]  MEDICAL RECORD NO.:  [id number]  LOCATION:  1602                         FACILITY:  [facility]  PHYSICIAN:  Laura Frederic, MD     DATE OF BIRTH:  1920/01/13  DATE OF PROCEDURE:  10/22/2015 DATE OF DISCHARGE:                              OPERATIVE REPORT   SURGEON:  Laura Frederic, MD.  ASSISTANT:  Laura Lowe, PAC.  PREOPERATIVE DIAGNOSIS:  Left intertrochanteric femur fracture.  POSTOPERATIVE DIAGNOSIS:  Left intertrochanteric femur fracture.  PROCEDURE PERFORMED:  Intramedullary fixation of left intertrochanteric femur fracture.  IMPLANTS: 1. Synthes TFNA nail size 10 x 360 mm, 130 degrees. 2. A 95 mm Titanium lag screw. 3. A 5.0 x 32 mm distal interlocking screw x1.  ANESTHESIA:  Spinal.  ANTIBIOTICS:  Ancef 2 g.  COMPLICATIONS:  None.  TUBES AND DRAINS:  None.  DISPOSITION:  Stable to PACU.  INDICATIONS:  The patient is a 82 year old, demented who had a ground level fall at home yesterday.  She was brought to the Emergency Department, found to have an intertrochanteric femur fracture.  She was admitted to the Hospitalist Service, underwent perioperative risk stratification, medical optimization.  Risks, benefits, and alternatives to surgical fixation were explained to the patient and her daughter. They elected to proceed.  DESCRIPTION OF PROCEDURE IN DETAIL:  I identified the patient in the holding area using 2 identifiers.  She was taken to the operating room. Spinal anesthesia was induced.  She was transferred to the [facility] table. Right lower extremity was scissored underneath the left.  I reduced the fracture with standard traction, internal rotation, and adduction.  I used fluoroscopy to define her anatomy.  I made a standard 4-cm incision proximal to the tip of the trochanter.  I used the awl to determine the standard starting point for trochanteric entry nail.  I inserted the guidewire down to the  anterior flange of her total knee replacement.  I reamed up to 11/5 reamer with good chatter.  Therefore, I selected a 10 x 360 nail.  I passed the nail without any difficulty.  Through a separate stab incision, I inserted the cannula down to the bone for the cephalomedullary device.  I inserted a guide pin center-center.  I measured, I reamed, and I placed the real lag screw.  I compressed the fracture through the jig.  I tightened the set screw and loosened it one- quarter turn.  I removed the jig.  I used perfect circle technique to place a single distal interlocking screw.  Final AP and lateral fluoroscopy views were obtained.  Fracture reduction was near anatomic. Tip-apex distance was appropriate.  No chondral penetration.  Wounds were copiously irrigated with saline.  I closed the wounds in layers with #1 Vicryl for the fascia, 2-0 Monocryl for the deep dermal layer, and running 3-0 Monocryl subcuticular stitch.  Glue was applied to the skin.  Once the glue was dried, sterile dressings were applied.  The patient was aroused from anesthesia, transferred to a stretcher, and taken to the PACU in stable condition.  Sponge, needle, and instrument counts were correct at the end of the case x2.  There were no known complications.  We will readmit the patient to the hospitalist.  She may be 50% weightbearing left lower extremity with a walker.  We will place her on Lovenox for DVT prophylaxis.  She will work with Physical Therapy.  She will likely need skilled nursing facility placement.  I will see her in the office 2 weeks after discharge.  All questions solicited and answered.          ______________________________ Laura Lowe Laura FredericSwinteck, MD     BS/MEDQ  D:  10/22/2015  T:  10/23/2015  Job:  [id number]

## 2015-10-23 NOTE — Progress Notes (Addendum)
   Subjective:  Patient reports pain as mild to moderate.  No events.  Objective:   VITALS:   Filed Vitals:   10/23/15 0027 10/23/15 0142 10/23/15 0250 10/23/15 0412  BP: 128/54 109/71 127/42 128/43  Pulse: 78 76 84 86  Temp: 98 F (36.7 C) 98 F (36.7 C) 98.5 F (36.9 C) 97.8 F (36.6 C)  TempSrc:  Axillary Oral Oral  Resp: 16 14 15 15   Height:      Weight:      SpO2: 94% 95% 96% 100%    ABD soft Sensation intact distally Intact pulses distally Dorsiflexion/Plantar flexion intact Incision: dressing C/D/I Compartment soft   Lab Results  Component Value Date   WBC 12.6* 10/23/2015   HGB 8.1* 10/23/2015   HCT 25.6* 10/23/2015   MCV 93.1 10/23/2015   PLT 172 10/23/2015   BMET    Component Value Date/Time   NA 140 10/23/2015 0343   K 3.7 10/23/2015 0343   CL 109 10/23/2015 0343   CO2 26 10/23/2015 0343   GLUCOSE 123* 10/23/2015 0343   BUN 16 10/23/2015 0343   CREATININE 0.75 10/23/2015 0343   CREATININE 1.67* 05/07/2015 1108   CALCIUM 8.0* 10/23/2015 0343   GFRNONAA >60 10/23/2015 0343   GFRNONAA 26* 05/07/2015 1108   GFRAA >60 10/23/2015 0343   GFRAA 30* 05/07/2015 1108     Assessment/Plan: 1 Day Post-Op   Principal Problem:   Closed left hip fracture (HCC) Active Problems:   Dementia   Hypothyroidism   Essential hypertension   Hip fracture (HCC)   Fracture, intertrochanteric, left femur (HCC)    50% WB LLE with walker DVT ppx: lovenox x30 days, SCDs, TEDs PO pain control ABLA on chronic anemia: per hospitalist team PT/OT Dispo: SNF placement   Laura Lowe, Laura Lowe 10/23/2015, 7:19 AM   Laura FredericBrian Deryck Hippler, MD Cell [phone]

## 2015-10-23 NOTE — Clinical Social Work Placement (Signed)
   CLINICAL SOCIAL WORK PLACEMENT  NOTE  Date:  10/23/2015  Patient Details  Name: Laura Lowe MRN: [id number] Date of Birth: 1919-08-02  Clinical Social Work is seeking post-discharge placement for this patient at the Skilled  Nursing Facility level of care (*CSW will initial, date and re-position this form in  chart as items are completed):  Yes   Patient/family provided with [place] Clinical Social Work Department's list of facilities offering this level of care within the geographic area requested by the patient (or if unable, by the patient's family).  Yes   Patient/family informed of their freedom to choose among providers that offer the needed level of care, that participate in Medicare, Medicaid or managed care program needed by the patient, have an available bed and are willing to accept the patient.  Yes   Patient/family informed of [facility]'s ownership interest in [facility] and [facility], as well as of the fact that they are under no obligation to receive care at these facilities.  PASRR submitted to EDS on 10/23/15     PASRR number received on 10/23/15     Existing PASRR number confirmed on       FL2 transmitted to all facilities in geographic area requested by pt/family on 10/23/15     FL2 transmitted to all facilities within larger geographic area on       Patient informed that his/her managed care company has contracts with or will negotiate with certain facilities, including the following:            Patient/family informed of bed offers received.  Patient chooses bed at       Physician recommends and patient chooses bed at      Patient to be transferred to   on  .  Patient to be transferred to facility by       Patient family notified on   of transfer.  Name of family member notified:        PHYSICIAN       Additional Comment:    _______________________________________________ Arlyss RepressHarrison, Aundrea Horace F, LCSW 10/23/2015, 1:19 PM

## 2015-10-23 NOTE — Clinical Social Work Note (Signed)
Clinical Social Work Assessment  Patient Details  Name: Laura Lowe MRN: [id number] Date of Birth: February 10, 1920  Date of referral:  10/23/15               Reason for consult:  Facility Placement                Permission sought to share information with:  [facility] Contact Representative Permission granted to share information::  Yes, Verbal Permission Granted  Name::        Agency::     Relationship::     Contact Information:     Housing/Transportation Living arrangements for the past 2 months:  Single Family Home Source of Information:  Adult Children Patient Interpreter Needed:  None Criminal Activity/Legal Involvement Pertinent to Current Situation/Hospitalization:  No - Comment as needed Significant Relationships:  Adult Children Lives with:  Adult Children Do you feel safe going back to the place where you live?  No Need for family participation in patient care:  Yes (Comment)  Care giving concerns:  CSW received consult for SNF placement.    Social Worker assessment / plan:  CSW spoke with patient's daughter, Lupita LeashDonna re: discharge planning. Daughter is agreeable with plan for SNF placement.   Employment status:  Retired Health and safety inspectornsurance information:  [employer] PT Recommendations:  Skilled Nursing Facility Information / Referral to community resources:  Skilled Nursing Facility  Patient/Family's Response to care:  Patient's daughter informed CSW that patient has been living with her but feels that patient would greatly benefit from going to a SNF or ALF for long term. CSW [place] [facility] re: bed offer.   Patient/Family's Understanding of and Emotional Response to Diagnosis, Current Treatment, and Prognosis:    Emotional Assessment Appearance:  Appears stated age Attitude/Demeanor/Rapport:    Affect (typically observed):    Orientation:  Oriented to Self Alcohol / Substance use:    Psych involvement (Current and /or in the community):     Discharge  Needs  Concerns to be addressed:    Readmission within the last 30 days:    Current discharge risk:    Barriers to Discharge:      Arlyss RepressHarrison, Maverik Foot F, LCSW 10/23/2015, 10:44 AM

## 2015-10-23 NOTE — Progress Notes (Signed)
PROGRESS NOTE                                                                                                                                                                                                             Patient Demographics:    Laura Lowe, is a 82 y.o. female, DOB - 02/12/1920, [id number]  Admit date - 10/21/2015   Admitting Physician Eduard ClosArshad N Kakrakandy, MD  Outpatient Primary MD for the patient is Margaree MackintoshBAXLEY,MARY J, MD  LOS - 1  Chief Complaint  Patient presents with  . Fall       Brief Narrative  82 y.o Female with a PMH of dementia, HTN, hypothyroidism presented to the ED for a unwitnessed fall and left leg pain. X-ray showing Left hip fracture. CT head and neck unremarkable except for lung nodule. Marlan PalauOrthro was consulted, Underwent intramedullary nailing on 6/9.   Subjective:   She is resting comfortable. Presently confused   Assessment  & Plan :    Displaced left intertrochanteric hip fracture: Secondary to mechanical fall. Ortho has been consulted, Underwent intramedullary nail placement on 6/9. and plan to have ORIF today. Recommendations from orthopedics are to continue Lovenox for 30 days, 50% weightbearing to left lower extremity with walker. Suspect will require SNF on discharge. Continue supportive care  Anemia: Multifactorial- Secondary to perioperative blood loss-but has chronic iron deficiency at baseline, transfuse 1 unit of PRBC today.  Hypertension:controlled, continue ramipril. Follow  Leukocytosis: likely reactive, Chest x-ray and UA are negative. She slowly down trending. Continue to monitor off antibiotics  Hypothyroidism: Continue Levothyroxine.   Dementia: Suspect close to her usual baseline. Continue Zyprexa, Paxil and Namenda.   4 mm lung nodule in RUL:  Follow up as outpatient with PCP  Code Status : Do Not Resuscitate  Family Communication  :  None at bedside this  morning, called her daughter Ulyess BlossomDonna Steele at [phone] to get through.  Disposition Plan  :  SNF likely early next week  Consults  :  Orthopedics  Procedures  :  none  DVT Prophylaxis  :  Lovenox  Lab Results  Component Value Date   PLT 172 10/23/2015    Inpatient Medications  Scheduled Meds: . docusate sodium  100 mg Oral BID  . enoxaparin (LOVENOX) injection  40 mg Subcutaneous Q24H  . ferrous sulfate  325  mg Oral Daily  . furosemide  20 mg Intravenous Once  . latanoprost  1 drop Both Eyes QHS  . levothyroxine  50 mcg Oral QAC breakfast  . memantine  10 mg Oral BID  . OLANZapine  5 mg Oral BID  . PARoxetine  10 mg Oral Daily  . ramipril  10 mg Oral Daily  . senna  1 tablet Oral BID   Continuous Infusions:   PRN Meds:.acetaminophen **OR** acetaminophen, albuterol, diazepam, fluticasone, hydrALAZINE, HYDROcodone-acetaminophen, menthol-cetylpyridinium **OR** phenol, metoCLOPramide **OR** metoCLOPramide (REGLAN) injection, morphine injection, ondansetron **OR** ondansetron (ZOFRAN) IV  Antibiotics  :    Anti-infectives    Start     Dose/Rate Route Frequency Ordered Stop   10/23/15 0400  ceFAZolin (ANCEF) IVPB 2g/100 mL premix     2 g 200 mL/hr over 30 Minutes Intravenous Every 6 hours 10/23/15 0032 10/23/15 0849   10/22/15 1700  ceFAZolin (ANCEF) IVPB 2g/100 mL premix     2 g 200 mL/hr over 30 Minutes Intravenous On call to O.R. 10/22/15 1646 10/22/15 2201         Objective:   Filed Vitals:   10/23/15 0412 10/23/15 0934 10/23/15 1018 10/23/15 1046  BP: 128/43 87/35 108/49 101/36  Pulse: 86 100  81  Temp: 97.8 F (36.6 C) 97.9 F (36.6 C) 97.9 F (36.6 C) 98 F (36.7 C)  TempSrc: Oral Oral Oral Oral  Resp: 15 15  12   Height:      Weight:      SpO2: 100% 95% 96% 94%    Wt Readings from Last 3 Encounters:  10/22/15 54.432 kg (120 lb)  05/07/15 53.978 kg (119 lb)  10/23/14 64.411 kg (142 lb)     Intake/Output Summary (Last 24 hours) at  10/23/15 1246 Last data filed at 10/23/15 1031  Gross per 24 hour  Intake   2970 ml  Output   1150 ml  Net   1820 ml     Physical Exam  Awake, pleasantly confused.  No new F.N deficits, Normal affect [place].AT,PERRAL Supple Neck,No JVD, No cervical lymphadenopathy appriciated.  Symmetrical Chest wall movement, Good air movement bilaterally, CTAB RRR,No Gallops,Rubs or new Murmurs, No Parasternal Heave +ve B.Sounds, Abd Soft, No tenderness, No organomegaly appriciated, No rebound - guarding or rigidity. No Cyanosis, Clubbing or edema, No new Rash or bruise     Data Review:    CBC  Recent Labs Lab 10/22/15 0021 10/22/15 0533 10/23/15 0533  WBC 15.2* 13.6* 12.6*  HGB 10.7* 10.2* 8.1*  HCT 34.0* 32.6* 25.6*  PLT 219 227 172  MCV 93.7 90.8 93.1  MCH 29.5 28.4 29.5  MCHC 31.5 31.3 31.6  RDW 13.5 13.2 13.6  LYMPHSABS 1.2 2.2  --   MONOABS 1.0 1.4*  --   EOSABS 0.1 0.1  --   BASOSABS 0.0 0.0  --     Chemistries   Recent Labs Lab 10/22/15 0021 10/22/15 0533 10/23/15 0343  NA 141 142 140  K 4.2 4.1 3.7  CL 110 109 109  CO2 25 26 26   GLUCOSE 127* 125* 123*  BUN 27* 25* 16  CREATININE 0.92 0.82 0.75  CALCIUM 8.7* 8.9 8.0*  AST  --  20  --   ALT  --  12*  --   ALKPHOS  --  57  --   BILITOT  --  0.6  --    ------------------------------------------------------------------------------------------------------------------ No results for input(s): CHOL, HDL, LDLCALC, TRIG, CHOLHDL, LDLDIRECT in the last 72 hours.  No  results found for: HGBA1C ------------------------------------------------------------------------------------------------------------------ No results for input(s): TSH, T4TOTAL, T3FREE, THYROIDAB in the last 72 hours.  Invalid input(s): FREET3 ------------------------------------------------------------------------------------------------------------------ No results for input(s): VITAMINB12, FOLATE, FERRITIN, TIBC, IRON, RETICCTPCT in the last 72  hours.  Coagulation profile  Recent Labs Lab 10/22/15 0021  INR 1.01    No results for input(s): DDIMER in the last 72 hours.  Cardiac Enzymes No results for input(s): CKMB, TROPONINI, MYOGLOBIN in the last 168 hours.  Invalid input(s): CK ------------------------------------------------------------------------------------------------------------------ No results found for: BNP  Micro Results Recent Results (from the past 240 hour(s))  Surgical pcr screen     Status: None   Collection Time: 10/22/15  5:51 AM  Result Value Ref Range Status   MRSA, PCR NEGATIVE NEGATIVE Final   Staphylococcus aureus NEGATIVE NEGATIVE Final    Comment:        The Xpert SA Assay (FDA approved for NASAL specimens in patients over 30 years of age), is one component of a comprehensive surveillance program.  Test performance has been validated by [facility] for patients greater than or equal to 51 year old. It is not intended to diagnose infection nor to guide or monitor treatment.   Urine culture     Status: None   Collection Time: 10/22/15  5:54 AM  Result Value Ref Range Status   Specimen Description URINE, CLEAN CATCH  Final   Special Requests NONE  Final   Culture NO GROWTH Performed at [facility]   Final   Report Status 10/23/2015 FINAL  Final    Radiology Reports Dg Elbow Complete Left  10/22/2015  CLINICAL DATA:  Fall with hematoma to the left elbow. Initial encounter. EXAM: LEFT ELBOW - COMPLETE 3+ VIEW COMPARISON:  None. FINDINGS: Soft tissue swelling over the olecranon process correlating with history. No joint effusion, fracture, or dislocation. IMPRESSION: Soft tissue swelling without acute osseous finding. Electronically Signed   By: Marnee Spring M.D.   On: 10/22/2015 00:35   Dg Forearm Left  10/22/2015  CLINICAL DATA:  Fall with swelling to the olecranon process. Initial encounter. EXAM: LEFT FOREARM - 2 VIEW COMPARISON:  08/20/2010 wrist radiography FINDINGS:  Soft tissue swelling over the olecranon process. Negative for acute fracture or dislocation. There is a remote distal radius fracture with healed malalignment. Remote ulnar styloid process fracture. Osteopenia. First CMC osteoarthritis. IMPRESSION: 1. No acute osseous finding. 2. Remote distal radius and ulnar fractures. Electronically Signed   By: Marnee Spring M.D.   On: 10/22/2015 00:37   Dg Knee 1-2 Views Left  10/22/2015  CLINICAL DATA:  Initial evaluation for acute trauma, fall. EXAM: LEFT KNEE - 1-2 VIEW COMPARISON:  None. FINDINGS: Cemented total knee arthroplasty in place. No periprosthetic fracture. No periprosthetic lucency to suggest loosening or infection. No other acute osseous abnormality about the knee. Osteopenia. No soft tissue abnormality. IMPRESSION: 1. No acute fracture or dislocation. 2. Left total knee arthroplasty in place without complication. 3. Osteopenia. Electronically Signed   By: Rise Mu M.D.   On: 10/22/2015 00:40   Ct Head Wo Contrast  10/22/2015  CLINICAL DATA:  Initial evaluation for acute trauma, unwitnessed fall. EXAM: CT HEAD WITHOUT CONTRAST CT CERVICAL SPINE WITHOUT CONTRAST TECHNIQUE: Multidetector CT imaging of the head and cervical spine was performed following the standard protocol without intravenous contrast. Multiplanar CT image reconstructions of the cervical spine were also generated. COMPARISON:  Prior CT from 08/20/2010. FINDINGS: CT HEAD FINDINGS Scalp soft tissues demonstrate no acute abnormality. No acute abnormality  about the orbits. Paranasal sinuses are clear.  No mastoid effusion. Calvarium intact. Scattered vascular calcifications within the carotid siphons. Age-related cerebral atrophy with mild chronic small vessel ischemic disease. No acute intracranial hemorrhage or large vessel territory infarct. No mass lesion, midline shift, or mass effect. No hydrocephalus. No extra-axial fluid collection. CT CERVICAL SPINE FINDINGS Reversal of the  normal cervical lordosis with apex at C5. Trace anterolisthesis of C3 on C4 and C4 on C5. Vertebral body heights are preserved. Normal C1-2 articulations are intact. No prevertebral soft tissue swelling. No acute fracture or listhesis. Incomplete fusion of the posterior ring of C1 noted. Moderate to advanced degenerative spondylolysis at C4-5, C5-6, and C6-7. Multilevel facet arthrosis within the upper cervical spine. Visualized soft tissues of the neck are within normal limits. Visualized lung apices are clear without evidence of apical pneumothorax. 4 mm nodule within the right upper lobe (series 7, image 65). IMPRESSION: CT BRAIN: 1. No acute intracranial process. 2. Generalized cerebral atrophy with mild chronic microvascular ischemic disease, stable. CT CERVICAL SPINE: 1. No acute traumatic injury within cervical spine. 2. Reversal of the normal cervical lordosis with advanced degenerative spondylolysis at C4-5, C5-6, and C6-7, stable. 3. 4 mm right upper lobe nodule, indeterminate. No follow-up needed if patient is low-risk. Non-contrast chest CT can be considered in 12 months if patient is high-risk. This recommendation follows the consensus statement: Guidelines for Management of Incidental Pulmonary Nodules Detected on CT Images:From the Fleischner Society 2017; published online before print (10.1148/[id number]). Electronically Signed   By: Rise Mu M.D.   On: 10/22/2015 01:42   Ct Cervical Spine Wo Contrast  10/22/2015  CLINICAL DATA:  Initial evaluation for acute trauma, unwitnessed fall. EXAM: CT HEAD WITHOUT CONTRAST CT CERVICAL SPINE WITHOUT CONTRAST TECHNIQUE: Multidetector CT imaging of the head and cervical spine was performed following the standard protocol without intravenous contrast. Multiplanar CT image reconstructions of the cervical spine were also generated. COMPARISON:  Prior CT from 08/20/2010. FINDINGS: CT HEAD FINDINGS Scalp soft tissues demonstrate no acute  abnormality. No acute abnormality about the orbits. Paranasal sinuses are clear.  No mastoid effusion. Calvarium intact. Scattered vascular calcifications within the carotid siphons. Age-related cerebral atrophy with mild chronic small vessel ischemic disease. No acute intracranial hemorrhage or large vessel territory infarct. No mass lesion, midline shift, or mass effect. No hydrocephalus. No extra-axial fluid collection. CT CERVICAL SPINE FINDINGS Reversal of the normal cervical lordosis with apex at C5. Trace anterolisthesis of C3 on C4 and C4 on C5. Vertebral body heights are preserved. Normal C1-2 articulations are intact. No prevertebral soft tissue swelling. No acute fracture or listhesis. Incomplete fusion of the posterior ring of C1 noted. Moderate to advanced degenerative spondylolysis at C4-5, C5-6, and C6-7. Multilevel facet arthrosis within the upper cervical spine. Visualized soft tissues of the neck are within normal limits. Visualized lung apices are clear without evidence of apical pneumothorax. 4 mm nodule within the right upper lobe (series 7, image 65). IMPRESSION: CT BRAIN: 1. No acute intracranial process. 2. Generalized cerebral atrophy with mild chronic microvascular ischemic disease, stable. CT CERVICAL SPINE: 1. No acute traumatic injury within cervical spine. 2. Reversal of the normal cervical lordosis with advanced degenerative spondylolysis at C4-5, C5-6, and C6-7, stable. 3. 4 mm right upper lobe nodule, indeterminate. No follow-up needed if patient is low-risk. Non-contrast chest CT can be considered in 12 months if patient is high-risk. This recommendation follows the consensus statement: Guidelines for Management of Incidental Pulmonary Nodules Detected on CT  Images:From the Fleischner Society 2017; published online before print (10.1148/[id number]). Electronically Signed   By: Rise Mu M.D.   On: 10/22/2015 01:42   Pelvis Portable  10/23/2015  CLINICAL DATA:   Postoperative radiograph status post internal fixation of left femoral fracture. Initial encounter. EXAM: PORTABLE PELVIS 1-2 VIEWS COMPARISON:  Left hip radiographs performed 10/21/2015 FINDINGS: There has been placement of an intramedullary rod and screw, transfixing the patient's left femoral intertrochanteric fracture in grossly anatomic alignment. The distal aspect of the rod and screw are imaged on concurrent left femur radiographs. The left femoral head remains seated at the acetabulum. The right hip joint is grossly unremarkable. Scattered vascular calcifications are seen. IMPRESSION: Status post internal fixation of left femoral intertrochanteric fracture in grossly anatomic alignment. Electronically Signed   By: Roanna Raider M.D.   On: 10/23/2015 00:32   Dg Chest Port 1 View  10/22/2015  CLINICAL DATA:  Preop for hip fracture. EXAM: PORTABLE CHEST 1 VIEW COMPARISON:  07/02/2014 FINDINGS: Chronic cardiomegaly. Lower mediastinal widening from hiatal hernia, better seen previously. Negative aortic contours. Interstitial coarsening likely from low volumes. No edema, pneumonia, effusion, or pneumothorax. No appreciable fracture. IMPRESSION: Cardiomegaly without failure. Electronically Signed   By: Marnee Spring M.D.   On: 10/22/2015 01:01   Dg Knee Left Port  10/22/2015  CLINICAL DATA:  Pain following fall EXAM: PORTABLE LEFT KNEE - 1-2 VIEW COMPARISON:  October 21, 2015 FINDINGS: Frontal and lateral views were obtained. There is a total knee replacement with femoral and tibial prosthetic components appearing well-seated. No acute fracture or dislocation evident. No joint effusion. No erosive change. There is arterial vascular calcification in the superficial femoral artery. IMPRESSION: Prosthetic components appear well seated. No fracture or dislocation. No joint effusion. There is arterial vascular calcification present. Electronically Signed   By: Bretta Bang III M.D.   On: 10/22/2015 08:22   Dg  C-arm 1-60 Min-no Report  10/22/2015  CLINICAL DATA: surgery C-ARM 1-60 MINUTES Fluoroscopy was utilized by the requesting physician.  No radiographic interpretation.   Dg Hip Port Unilat With Pelvis 1v Left  10/23/2015  CLINICAL DATA:  Intraoperative radiograph for left femoral intramedullary nail placement. Initial encounter. EXAM: DG HIP (WITH OR WITHOUT PELVIS) 1V PORT LEFT COMPARISON:  Left hip radiographs performed 10/21/2015 FINDINGS: Four fluoroscopic C-arm images are provided from the OR, demonstrating placement of an intramedullary rod and screws across the patient's left femoral intertrochanteric fracture, transfixing the fracture in grossly anatomic alignment. The left femoral head remains seated at the acetabulum. The patient's left total knee arthroplasty is grossly unremarkable in appearance. IMPRESSION: Status post internal fixation of left femoral intertrochanteric fracture in grossly anatomic alignment. Electronically Signed   By: Roanna Raider M.D.   On: 10/23/2015 04:03   Dg Hip Unilat With Pelvis 2-3 Views Left  10/22/2015  CLINICAL DATA:  Fall with left hip deformity.  Initial encounter. EXAM: DG HIP (WITH OR WITHOUT PELVIS) 2-3V LEFT COMPARISON:  None. FINDINGS: Acute intertrochanteric left femur fracture with varus angulation from medial impaction. Located hips. No evidence of pelvic ring fracture or diastasis. Osteopenia and atherosclerosis. IMPRESSION: Displaced intertrochanteric left femur fracture. Electronically Signed   By: Marnee Spring M.D.   On: 10/22/2015 00:34   Dg Femur Min 2 Views Left  10/22/2015  CLINICAL DATA:  Fall with left hip deformity.  Initial encounter. EXAM: LEFT FEMUR 2 VIEWS COMPARISON:  None. FINDINGS: An intertrochanteric femur fracture is described on contemporaneous hip radiography. No distal fracture is seen.  Status post total knee arthroplasty with partial visualization of the tibial component. Osteopenia. IMPRESSION: Displaced intertrochanteric left  femur fracture. Electronically Signed   By: Marnee Spring M.D.   On: 10/22/2015 00:39   Dg Femur Port Min 2 Views Left  10/23/2015  CLINICAL DATA:  Postoperative radiograph for left femoral internal fixation. Initial encounter. EXAM: LEFT FEMUR PORTABLE 2 VIEWS COMPARISON:  Left femoral radiographs performed 10/21/2015 FINDINGS: There has been interval placement of an intramedullary rod and screws within the left femur, transfixing the intertrochanteric fracture in grossly anatomic alignment. No new fractures are seen. The patient's left knee total arthroplasty is grossly unremarkable in appearance. No significant knee joint effusion is seen. Scattered postoperative soft tissue air is noted. The left femoral head remains seated at the acetabulum. IMPRESSION: Status post internal fixation of left femoral intertrochanteric fracture in grossly anatomic alignment. Electronically Signed   By: Roanna Raider M.D.   On: 10/23/2015 00:23    Time Spent in minutes 25  GHIMIRE,SHANKER on 10/23/2015 at 12:46 PM

## 2015-10-23 NOTE — Evaluation (Signed)
Physical Therapy Evaluation Patient Details Name: Laura Lowe MRN: [id number] DOB: March 15, 1920 Today's Date: 10/23/2015   History of Present Illness  Pt s/p fall with L hip fx and IM nailing repair.  Pt with hx of L TKR, glaucoma, hearing deficits and significant dementia  Clinical Impression  Pt admitted as above and presenting with functional mobility limitations 2* decreased L LE strength/ROM, post op pain, PWB status and significant dementia related cognitive deficits.  Pt would benefit from follow up rehab at SNF level.    Follow Up Recommendations SNF    Equipment Recommendations  None recommended by PT    Recommendations for Other Services OT consult     Precautions / Restrictions Precautions Precautions: Fall Restrictions Weight Bearing Restrictions: Yes LLE Weight Bearing: Partial weight bearing LLE Partial Weight Bearing Percentage or Pounds: 50%      Mobility  Bed Mobility Overal bed mobility: Needs Assistance;+2 for physical assistance;+ 2 for safety/equipment Bed Mobility: Supine to Sit     Supine to sit: Max assist;+2 for physical assistance;+2 for safety/equipment     General bed mobility comments: Pt following min cues.  Pad utilized to bring pt to EOB.   Transfers Overall transfer level: Needs assistance Equipment used: Rolling walker (2 wheeled) Transfers: Sit to/from [employer]Stand;Stand Pivot Transfers Sit to Stand: Mod assist;+2 physical assistance;+2 safety/equipment Stand pivot transfers: Mod assist;+2 physical assistance       General transfer comment: Multimodal cues for use of UEs to self assist.  Physical assist to manage RW to initiate wt shifts to complete turn and for balance/support .  Ambulation/Gait             General Gait Details: Stand/pvt transfer only 2* pt pain level and difficulty following cues to participate  Stairs            Wheelchair Mobility    Modified Rankin (Stroke Patients Only)       Balance  Overall balance assessment: Needs assistance Sitting-balance support: No upper extremity supported;Feet supported Sitting balance-Leahy Scale: Fair     Standing balance support: Bilateral upper extremity supported Standing balance-Leahy Scale: Poor                               Pertinent Vitals/Pain Pain Assessment: Faces Faces Pain Scale: Hurts even more Pain Location: L leg Pain Intervention(s): Limited activity within patient's tolerance;Monitored during session;Ice applied    Home Living Family/patient expects to be discharged to:: Skilled nursing facility Living Arrangements: Children                    Prior Function Level of Independence: Needs assistance   Gait / Transfers Assistance Needed: IND with ambulation per dtr  ADL's / Homemaking Assistance Needed: Assisted by family        Hand Dominance        Extremity/Trunk Assessment   Upper Extremity Assessment: Difficult to assess due to impaired cognition           Lower Extremity Assessment: Difficult to assess due to impaired cognition      Cervical / Trunk Assessment: Kyphotic  Communication   Communication: HOH  Cognition Arousal/Alertness: Awake/alert Behavior During Therapy: WFL for tasks assessed/performed Overall Cognitive Status: History of cognitive impairments - at baseline                      General Comments      Exercises  Assessment/Plan    PT Assessment Patient needs continued PT services  PT Diagnosis Difficulty walking   PT Problem List Decreased strength;Decreased range of motion;Decreased activity tolerance;Decreased balance;Decreased mobility;Decreased cognition;Decreased knowledge of use of DME;Decreased safety awareness;Pain;Decreased knowledge of precautions  PT Treatment Interventions DME instruction;Gait training;Functional mobility training;Therapeutic activities;Therapeutic exercise;Patient/family education   PT Goals (Current  goals can be found in the Care Plan section) Acute Rehab PT Goals Patient Stated Goal: Family expresses goal of dc to rehab setting to regain level of IND prior to return home PT Goal Formulation: With patient/family Time For Goal Achievement: 10/30/15 Potential to Achieve Goals: Fair    Frequency Min 3X/week   Barriers to discharge        Co-evaluation               End of Session Equipment Utilized During Treatment: Gait belt Activity Tolerance: Patient limited by pain;Other (comment) Patient left: in chair;with call bell/phone within reach;with chair alarm set;with family/visitor present Nurse Communication: Mobility status         Time: 1354-1413 PT Time Calculation (min) (ACUTE ONLY): 19 min   Charges:   PT Evaluation $PT Eval Moderate Complexity: 1 Procedure     PT G Codes:        Edgardo Petrenko 10-29-2015, 3:56 PM

## 2015-10-23 NOTE — NC FL2 (Signed)
[place] MEDICAID FL2 LEVEL OF CARE SCREENING TOOL     IDENTIFICATION  Patient Name: Laura Lowe Birthdate: [birth year] Sex: female Admission Date (Current Location): 10/21/2015  [facility] and [place] Number:  Producer, television/film/video and Address:  [facility],  [address] [place] [address]      Provider Number: [id number]  Attending Physician Name and Address:  Maretta Bees, MD  Relative Name and Phone Number:       Current Level of Care: Hospital Recommended Level of Care: Skilled Nursing Facility Prior Approval Number:    Date Approved/Denied:   PASRR Number: [id number]  Discharge Plan: SNF    Current Diagnoses: Patient Active Problem List   Diagnosis Date Noted  . Fracture, intertrochanteric, left femur (HCC) 10/23/2015  . Closed left hip fracture (HCC) 10/22/2015  . Essential hypertension 10/22/2015  . Hip fracture (HCC) 10/22/2015  . Hypothyroidism 09/21/2013  . Hearing loss 03/19/2011  . Carotid bruit present 03/19/2011  . Allergic rhinitis 03/19/2011  . Anxiety 03/19/2011  . Insomnia 03/19/2011  . Osteoporosis 03/19/2011  . Osteoarthritis 03/19/2011  . Vitamin D deficiency 03/19/2011  . Glaucoma 03/19/2011  . Hypertension 12/30/2010  . Dementia 12/30/2010    Orientation RESPIRATION BLADDER Height & Weight     Self  Normal Continent Weight: 120 lb (54.432 kg) Height:   (139.7 cm)  BEHAVIORAL SYMPTOMS/MOOD NEUROLOGICAL BOWEL NUTRITION STATUS      Continent Diet  AMBULATORY STATUS COMMUNICATION OF NEEDS Skin   Extensive Assist Verbally Normal                       Personal Care Assistance Level of Assistance  Bathing, Dressing Bathing Assistance: Limited assistance   Dressing Assistance: Limited assistance     Functional Limitations Info             SPECIAL CARE FACTORS FREQUENCY  PT (By licensed PT), OT (By licensed OT)     PT Frequency: 5 OT Frequency: 5            Contractures       Additional Factors Info  Code Status, Allergies Code Status Info: DNR Allergies Info: Allergies:  Risedronate Sodium           Current Medications (10/23/2015):  This is the current hospital active medication list Current Facility-Administered Medications  Medication Dose Route Frequency Provider Last Rate Last Dose  . acetaminophen (TYLENOL) tablet 650 mg  650 mg Oral Q6H PRN Samson Frederic, MD   650 mg at 10/23/15 7846   Or  . acetaminophen (TYLENOL) suppository 650 mg  650 mg Rectal Q6H PRN Samson Frederic, MD      . albuterol (PROVENTIL) (2.5 MG/3ML) 0.083% nebulizer solution 2.5 mg  2.5 mg Nebulization Q6H PRN Eduard Clos, MD      . diazepam (VALIUM) tablet 5 mg  5 mg Oral BID PRN Eduard Clos, MD   5 mg at 10/23/15 9629  . docusate sodium (COLACE) capsule 100 mg  100 mg Oral BID Samson Frederic, MD   100 mg at 10/23/15 5284  . enoxaparin (LOVENOX) injection 40 mg  40 mg Subcutaneous Q24H Samson Frederic, MD      . ferrous sulfate tablet 325 mg  325 mg Oral Daily Eduard Clos, MD   325 mg at 10/23/15 1324  . fluticasone (FLONASE) 50 MCG/ACT nasal spray 2 spray  2 spray Each Nare Daily PRN Eduard Clos, MD      .  furosemide (LASIX) injection 20 mg  20 mg Intravenous Once Maretta BeesShanker M Ghimire, MD      . hydrALAZINE (APRESOLINE) injection 10 mg  10 mg Intravenous Q4H PRN Eduard ClosArshad N Kakrakandy, MD      . HYDROcodone-acetaminophen (NORCO/VICODIN) 5-325 MG per tablet 1-2 tablet  1-2 tablet Oral Q6H PRN Eduard ClosArshad N Kakrakandy, MD      . latanoprost (XALATAN) 0.005 % ophthalmic solution 1 drop  1 drop Both Eyes QHS Eduard ClosArshad N Kakrakandy, MD      . levothyroxine (SYNTHROID, LEVOTHROID) tablet 50 mcg  50 mcg Oral QAC breakfast Eduard ClosArshad N Kakrakandy, MD   50 mcg at 10/23/15 [id number]  . memantine (NAMENDA) tablet 10 mg  10 mg Oral BID Eduard ClosArshad N Kakrakandy, MD   10 mg at 10/23/15 [id number]  . menthol-cetylpyridinium (CEPACOL) lozenge 3 mg  1 lozenge Oral PRN Samson FredericBrian Swinteck, MD       Or   . phenol (CHLORASEPTIC) mouth spray 1 spray  1 spray Mouth/Throat PRN Samson FredericBrian Swinteck, MD      . metoCLOPramide (REGLAN) tablet 5-10 mg  5-10 mg Oral Q8H PRN Samson FredericBrian Swinteck, MD       Or  . metoCLOPramide (REGLAN) injection 5-10 mg  5-10 mg Intravenous Q8H PRN Samson FredericBrian Swinteck, MD      . morphine 2 MG/ML injection 0.5 mg  0.5 mg Intravenous Q2H PRN Eduard ClosArshad N Kakrakandy, MD   0.5 mg at 10/23/15 0546  . OLANZapine (ZYPREXA) tablet 5 mg  5 mg Oral BID Eduard ClosArshad N Kakrakandy, MD   5 mg at 10/23/15 [id number]  . ondansetron (ZOFRAN) tablet 4 mg  4 mg Oral Q6H PRN Samson FredericBrian Swinteck, MD       Or  . ondansetron [facility](ZOFRAN) injection 4 mg  4 mg Intravenous Q6H PRN Samson FredericBrian Swinteck, MD      . PARoxetine (PAXIL) tablet 10 mg  10 mg Oral Daily Eduard ClosArshad N Kakrakandy, MD   10 mg at 10/23/15 [id number]  . ramipril (ALTACE) capsule 10 mg  10 mg Oral Daily Eduard ClosArshad N Kakrakandy, MD   10 mg at 10/23/15 [id number]  . senna (SENOKOT) tablet 8.6 mg  1 tablet Oral BID Samson FredericBrian Swinteck, MD   8.6 mg at 10/23/15 [id number]     Discharge Medications: Please see discharge summary for a list of discharge medications.  Relevant Imaging Results:  Relevant Lab Results:   Additional Information SSN: [id number]  Arlyss RepressHarrison, Mikylah Ackroyd F, LCSW

## 2015-10-24 DIAGNOSIS — I10 Essential (primary) hypertension: Secondary | ICD-10-CM

## 2015-10-24 LAB — CBC
HCT: 28.9 % — ABNORMAL LOW (ref 36.0–46.0)
Hemoglobin: 9.5 g/dL — ABNORMAL LOW (ref 12.0–15.0)
MCH: 29.7 pg (ref 26.0–34.0)
MCHC: 32.9 g/dL (ref 30.0–36.0)
MCV: 90.3 fL (ref 78.0–100.0)
Platelets: 129 10*3/uL — ABNORMAL LOW (ref 150–400)
RBC: 3.2 MIL/uL — ABNORMAL LOW (ref 3.87–5.11)
RDW: 14.9 % (ref 11.5–15.5)
WBC: 10.2 10*3/uL (ref 4.0–10.5)

## 2015-10-24 MED ORDER — ENOXAPARIN SODIUM 40 MG/0.4ML ~~LOC~~ SOLN: 40.0000 mg | SUBCUTANEOUS | Status: DC

## 2015-10-24 MED ORDER — OXYCODONE HCL 5 MG PO TABS: 5.0000 mg | ORAL_TABLET | ORAL | Status: DC | PRN

## 2015-10-24 NOTE — Progress Notes (Signed)
Patient's daughter is [facility]

## 2015-10-24 NOTE — Progress Notes (Signed)
Subjective: 2 Days Post-Op Procedure(s) (LRB): INTRAMEDULLARY (IM) NAIL FEMORAL LEFT (Left) Patient reports pain as 1 on 0-10 scale. No Post-Op problems.   Objective: Vital signs in last 24 hours: Temp:  [97.8 F (36.6 C)-98.6 F (37 C)] 98 F (36.7 C) (06/11 0501) Pulse Rate:  [66-100] 66 (06/11 0501) Resp:  [12-16] 14 (06/11 0501) BP: (87-143)/(35-63) 128/46 mmHg (06/11 0501) SpO2:  [91 %-98 %] 96 % (06/11 0501)  Intake/Output from previous day: 06/10 0701 - 06/11 0700 In: 1120.4 [P.O.:480; I.V.:200; Blood:390.4; IV Piggyback:50] Out: 1400 [Urine:1400] Intake/Output this shift:     Recent Labs  10/22/15 0021 10/22/15 0533 10/23/15 0533 10/23/15 1837 10/24/15 0421  HGB 10.7* 10.2* 8.1* 9.6* 9.5*    Recent Labs  10/23/15 0533 10/23/15 1837 10/24/15 0421  WBC 12.6*  --  10.2  RBC 2.75*  --  3.20*  HCT 25.6* 29.4* 28.9*  PLT 172  --  129*    Recent Labs  10/22/15 0533 10/23/15 0343  NA 142 140  K 4.1 3.7  CL 109 109  CO2 26 26  BUN 25* 16  CREATININE 0.82 0.75  GLUCOSE 125* 123*  CALCIUM 8.9 8.0*    Recent Labs  10/22/15 0021  INR 1.01    Compartment soft  Assessment/Plan: 2 Days Post-Op Procedure(s) (LRB): INTRAMEDULLARY (IM) NAIL FEMORAL LEFT (Left) Up with therapy  Doni Widmer A 10/24/2015, 8:43 AM

## 2015-10-24 NOTE — Progress Notes (Signed)
OT Cancellation Note  Patient Details Name: Laura Lowe MRN: [id number] DOB: 1919-10-25   Cancelled Treatment:    Reason Eval/Treat Not Completed: Other (comment). Pt continues to have difficulty following commands. Will defer OT to SNF  [facility],Sachin Ferencz 10/24/2015, 10:59 AM  Marica OtterMaryellen Sarajane Fambrough, OTR/L [phone] 10/24/2015

## 2015-10-24 NOTE — Progress Notes (Signed)
PROGRESS NOTE                                                                                                                                                                                                             Patient Demographics:    Laura Lowe, is a 82 y.o. female, DOB - 15-Apr-1920, ZOX:[id number]  Admit date - 10/21/2015   Admitting Physician Eduard ClosArshad N Kakrakandy, MD  Outpatient Primary MD for the patient is Margaree MackintoshBAXLEY,MARY J, MD  LOS - 2  Chief Complaint  Patient presents with  . Fall       Brief Narrative  82 y.o Female with a PMH of dementia, HTN, hypothyroidism presented to the ED for a unwitnessed fall and left leg pain. X-ray showing Left hip fracture. CT head and neck unremarkable except for lung nodule. Marlan PalauOrthro was consulted, Underwent intramedullary nailing on 6/9.   Subjective:   Seems to be less confused than yesterday, lying comfortably in bed. No family at bedside.   Assessment  & Plan :    Displaced left intertrochanteric hip fracture: Secondary to mechanical fall. Ortho consulted, underwent intramedullary nail placement on 6/9. Recommendations from orthopedics are to continue Lovenox for 30 days, 50% weightbearing to left lower extremity with walker. Suspect will require SNF on discharge. Continue supportive care  Anemia: Multifactorial- Secondary to perioperative blood loss-but has chronic iron deficiency at baseline, transfused 1 unit of PRBC this admission.  Hypertension:controlled, continue ramipril. Follow  Leukocytosis: likely reactive, Chest x-ray and UA are negative. Leukocytosis slowly down trending. Continue to monitor off antibiotics  Hypothyroidism: Continue Levothyroxine.   Dementia: Suspect close to her usual baseline. Continue Zyprexa, Paxil and Namenda.   4 mm lung nodule in RUL:  Follow up as outpatient with PCP  Code Status : Do Not Resuscitate  Family Communication   : Lupita LeashDonna Steele-daughter over the phone  Disposition Plan  :  SNF tomorrow if doing well.  Consults: Orthopedics  Procedures  :  none  DVT Prophylaxis :  Lovenox  Lab Results  Component Value Date   PLT 129* 10/24/2015    Inpatient Medications  Scheduled Meds: . acetaminophen  1,000 mg Oral Q8H  . docusate sodium  100 mg Oral BID  . enoxaparin (LOVENOX) injection  40 mg Subcutaneous Q24H  . ferrous sulfate  325 mg Oral Daily  .  latanoprost  1 drop Both Eyes QHS  . levothyroxine  50 mcg Oral QAC breakfast  . memantine  10 mg Oral BID  . OLANZapine  5 mg Oral BID  . PARoxetine  10 mg Oral Daily  . polyethylene glycol  17 g Oral Daily  . ramipril  10 mg Oral Daily  . senna  1 tablet Oral BID   Continuous Infusions:   PRN Meds:.albuterol, diazepam, fluticasone, hydrALAZINE, menthol-cetylpyridinium **OR** phenol, metoCLOPramide **OR** metoCLOPramide (REGLAN) injection, morphine injection, ondansetron **OR** ondansetron (ZOFRAN) IV, oxyCODONE  Antibiotics  :    Anti-infectives    Start     Dose/Rate Route Frequency Ordered Stop   10/23/15 0400  ceFAZolin (ANCEF) IVPB 2g/100 mL premix     2 g 200 mL/hr over 30 Minutes Intravenous Every 6 hours 10/23/15 0032 10/23/15 0849   10/22/15 1700  ceFAZolin (ANCEF) IVPB 2g/100 mL premix     2 g 200 mL/hr over 30 Minutes Intravenous On call to O.R. 10/22/15 1646 10/22/15 2201         Objective:   Filed Vitals:   10/23/15 1339 10/23/15 1844 10/23/15 2045 10/24/15 0501  BP: 117/44 140/55 143/63 128/46  Pulse: 78 88 87 66  Temp: 98.3 F (36.8 C) 98.6 F (37 C) 97.8 F (36.6 C) 98 F (36.7 C)  TempSrc: Oral Oral Oral Axillary  Resp: 15 16 16 14   Height:      Weight:      SpO2: 91% 98% 98% 96%    Wt Readings from Last 3 Encounters:  10/22/15 54.432 kg (120 lb)  05/07/15 53.978 kg (119 lb)  10/23/14 64.411 kg (142 lb)     Intake/Output Summary (Last 24 hours) at 10/24/15 1020 Last data filed at 10/24/15 0951  Gross  per 24 hour  Intake 990.42 ml  Output   1250 ml  Net -259.58 ml     Physical Exam  Awake, pleasantly confused.  No new F.N deficits, Normal affect [place].AT,PERRAL Supple Neck,No JVD, No cervical lymphadenopathy appriciated.  Symmetrical Chest wall movement, Good air movement bilaterally, CTAB RRR,No Gallops,Rubs or new Murmurs, No Parasternal Heave +ve B.Sounds, Abd Soft, No tenderness, No organomegaly appriciated, No rebound - guarding or rigidity. No Cyanosis, Clubbing or edema, No new Rash or bruise     Data Review:    CBC  Recent Labs Lab 10/22/15 0021 10/22/15 0533 10/23/15 0533 10/23/15 1837 10/24/15 0421  WBC 15.2* 13.6* 12.6*  --  10.2  HGB 10.7* 10.2* 8.1* 9.6* 9.5*  HCT 34.0* 32.6* 25.6* 29.4* 28.9*  PLT 219 227 172  --  129*  MCV 93.7 90.8 93.1  --  90.3  MCH 29.5 28.4 29.5  --  29.7  MCHC 31.5 31.3 31.6  --  32.9  RDW 13.5 13.2 13.6  --  14.9  LYMPHSABS 1.2 2.2  --   --   --   MONOABS 1.0 1.4*  --   --   --   EOSABS 0.1 0.1  --   --   --   BASOSABS 0.0 0.0  --   --   --     Chemistries   Recent Labs Lab 10/22/15 0021 10/22/15 0533 10/23/15 0343  NA 141 142 140  K 4.2 4.1 3.7  CL 110 109 109  CO2 25 26 26   GLUCOSE 127* 125* 123*  BUN 27* 25* 16  CREATININE 0.92 0.82 0.75  CALCIUM 8.7* 8.9 8.0*  AST  --  20  --   ALT  --  12*  --   ALKPHOS  --  57  --   BILITOT  --  0.6  --    ------------------------------------------------------------------------------------------------------------------ No results for input(s): CHOL, HDL, LDLCALC, TRIG, CHOLHDL, LDLDIRECT in the last 72 hours.  No results found for: HGBA1C ------------------------------------------------------------------------------------------------------------------ No results for input(s): TSH, T4TOTAL, T3FREE, THYROIDAB in the last 72 hours.  Invalid input(s):  FREET3 ------------------------------------------------------------------------------------------------------------------ No results for input(s): VITAMINB12, FOLATE, FERRITIN, TIBC, IRON, RETICCTPCT in the last 72 hours.  Coagulation profile  Recent Labs Lab 10/22/15 0021  INR 1.01    No results for input(s): DDIMER in the last 72 hours.  Cardiac Enzymes No results for input(s): CKMB, TROPONINI, MYOGLOBIN in the last 168 hours.  Invalid input(s): CK ------------------------------------------------------------------------------------------------------------------ No results found for: BNP  Micro Results Recent Results (from the past 240 hour(s))  Surgical pcr screen     Status: None   Collection Time: 10/22/15  5:51 AM  Result Value Ref Range Status   MRSA, PCR NEGATIVE NEGATIVE Final   Staphylococcus aureus NEGATIVE NEGATIVE Final    Comment:        The Xpert SA Assay (FDA approved for NASAL specimens in patients over 30 years of age), is one component of a comprehensive surveillance program.  Test performance has been validated by [facility] for patients greater than or equal to 87 year old. It is not intended to diagnose infection nor to guide or monitor treatment.   Urine culture     Status: None   Collection Time: 10/22/15  5:54 AM  Result Value Ref Range Status   Specimen Description URINE, CLEAN CATCH  Final   Special Requests NONE  Final   Culture NO GROWTH Performed at [facility]   Final   Report Status 10/23/2015 FINAL  Final    Radiology Reports Dg Elbow Complete Left  10/22/2015  CLINICAL DATA:  Fall with hematoma to the left elbow. Initial encounter. EXAM: LEFT ELBOW - COMPLETE 3+ VIEW COMPARISON:  None. FINDINGS: Soft tissue swelling over the olecranon process correlating with history. No joint effusion, fracture, or dislocation. IMPRESSION: Soft tissue swelling without acute osseous finding. Electronically Signed   By: Marnee Spring M.D.    On: 10/22/2015 00:35   Dg Forearm Left  10/22/2015  CLINICAL DATA:  Fall with swelling to the olecranon process. Initial encounter. EXAM: LEFT FOREARM - 2 VIEW COMPARISON:  08/20/2010 wrist radiography FINDINGS: Soft tissue swelling over the olecranon process. Negative for acute fracture or dislocation. There is a remote distal radius fracture with healed malalignment. Remote ulnar styloid process fracture. Osteopenia. First CMC osteoarthritis. IMPRESSION: 1. No acute osseous finding. 2. Remote distal radius and ulnar fractures. Electronically Signed   By: Marnee Spring M.D.   On: 10/22/2015 00:37   Dg Knee 1-2 Views Left  10/22/2015  CLINICAL DATA:  Initial evaluation for acute trauma, fall. EXAM: LEFT KNEE - 1-2 VIEW COMPARISON:  None. FINDINGS: Cemented total knee arthroplasty in place. No periprosthetic fracture. No periprosthetic lucency to suggest loosening or infection. No other acute osseous abnormality about the knee. Osteopenia. No soft tissue abnormality. IMPRESSION: 1. No acute fracture or dislocation. 2. Left total knee arthroplasty in place without complication. 3. Osteopenia. Electronically Signed   By: Rise Mu M.D.   On: 10/22/2015 00:40   Ct Head Wo Contrast  10/22/2015  CLINICAL DATA:  Initial evaluation for acute trauma, unwitnessed fall. EXAM: CT HEAD WITHOUT CONTRAST CT CERVICAL SPINE WITHOUT CONTRAST TECHNIQUE: Multidetector CT imaging of the head and  cervical spine was performed following the standard protocol without intravenous contrast. Multiplanar CT image reconstructions of the cervical spine were also generated. COMPARISON:  Prior CT from 08/20/2010. FINDINGS: CT HEAD FINDINGS Scalp soft tissues demonstrate no acute abnormality. No acute abnormality about the orbits. Paranasal sinuses are clear.  No mastoid effusion. Calvarium intact. Scattered vascular calcifications within the carotid siphons. Age-related cerebral atrophy with mild chronic small vessel ischemic  disease. No acute intracranial hemorrhage or large vessel territory infarct. No mass lesion, midline shift, or mass effect. No hydrocephalus. No extra-axial fluid collection. CT CERVICAL SPINE FINDINGS Reversal of the normal cervical lordosis with apex at C5. Trace anterolisthesis of C3 on C4 and C4 on C5. Vertebral body heights are preserved. Normal C1-2 articulations are intact. No prevertebral soft tissue swelling. No acute fracture or listhesis. Incomplete fusion of the posterior ring of C1 noted. Moderate to advanced degenerative spondylolysis at C4-5, C5-6, and C6-7. Multilevel facet arthrosis within the upper cervical spine. Visualized soft tissues of the neck are within normal limits. Visualized lung apices are clear without evidence of apical pneumothorax. 4 mm nodule within the right upper lobe (series 7, image 65). IMPRESSION: CT BRAIN: 1. No acute intracranial process. 2. Generalized cerebral atrophy with mild chronic microvascular ischemic disease, stable. CT CERVICAL SPINE: 1. No acute traumatic injury within cervical spine. 2. Reversal of the normal cervical lordosis with advanced degenerative spondylolysis at C4-5, C5-6, and C6-7, stable. 3. 4 mm right upper lobe nodule, indeterminate. No follow-up needed if patient is low-risk. Non-contrast chest CT can be considered in 12 months if patient is high-risk. This recommendation follows the consensus statement: Guidelines for Management of Incidental Pulmonary Nodules Detected on CT Images:From the Fleischner Society 2017; published online before print (10.1148/[id number]). Electronically Signed   By: Rise Mu M.D.   On: 10/22/2015 01:42   Ct Cervical Spine Wo Contrast  10/22/2015  CLINICAL DATA:  Initial evaluation for acute trauma, unwitnessed fall. EXAM: CT HEAD WITHOUT CONTRAST CT CERVICAL SPINE WITHOUT CONTRAST TECHNIQUE: Multidetector CT imaging of the head and cervical spine was performed following the standard protocol  without intravenous contrast. Multiplanar CT image reconstructions of the cervical spine were also generated. COMPARISON:  Prior CT from 08/20/2010. FINDINGS: CT HEAD FINDINGS Scalp soft tissues demonstrate no acute abnormality. No acute abnormality about the orbits. Paranasal sinuses are clear.  No mastoid effusion. Calvarium intact. Scattered vascular calcifications within the carotid siphons. Age-related cerebral atrophy with mild chronic small vessel ischemic disease. No acute intracranial hemorrhage or large vessel territory infarct. No mass lesion, midline shift, or mass effect. No hydrocephalus. No extra-axial fluid collection. CT CERVICAL SPINE FINDINGS Reversal of the normal cervical lordosis with apex at C5. Trace anterolisthesis of C3 on C4 and C4 on C5. Vertebral body heights are preserved. Normal C1-2 articulations are intact. No prevertebral soft tissue swelling. No acute fracture or listhesis. Incomplete fusion of the posterior ring of C1 noted. Moderate to advanced degenerative spondylolysis at C4-5, C5-6, and C6-7. Multilevel facet arthrosis within the upper cervical spine. Visualized soft tissues of the neck are within normal limits. Visualized lung apices are clear without evidence of apical pneumothorax. 4 mm nodule within the right upper lobe (series 7, image 65). IMPRESSION: CT BRAIN: 1. No acute intracranial process. 2. Generalized cerebral atrophy with mild chronic microvascular ischemic disease, stable. CT CERVICAL SPINE: 1. No acute traumatic injury within cervical spine. 2. Reversal of the normal cervical lordosis with advanced degenerative spondylolysis at C4-5, C5-6, and C6-7, stable. 3. 4  mm right upper lobe nodule, indeterminate. No follow-up needed if patient is low-risk. Non-contrast chest CT can be considered in 12 months if patient is high-risk. This recommendation follows the consensus statement: Guidelines for Management of Incidental Pulmonary Nodules Detected on CT Images:From  the Fleischner Society 2017; published online before print (10.1148/[id number]). Electronically Signed   By: Rise Mu M.D.   On: 10/22/2015 01:42   Pelvis Portable  10/23/2015  CLINICAL DATA:  Postoperative radiograph status post internal fixation of left femoral fracture. Initial encounter. EXAM: PORTABLE PELVIS 1-2 VIEWS COMPARISON:  Left hip radiographs performed 10/21/2015 FINDINGS: There has been placement of an intramedullary rod and screw, transfixing the patient's left femoral intertrochanteric fracture in grossly anatomic alignment. The distal aspect of the rod and screw are imaged on concurrent left femur radiographs. The left femoral head remains seated at the acetabulum. The right hip joint is grossly unremarkable. Scattered vascular calcifications are seen. IMPRESSION: Status post internal fixation of left femoral intertrochanteric fracture in grossly anatomic alignment. Electronically Signed   By: Roanna Raider M.D.   On: 10/23/2015 00:32   Dg Chest Port 1 View  10/22/2015  CLINICAL DATA:  Preop for hip fracture. EXAM: PORTABLE CHEST 1 VIEW COMPARISON:  07/02/2014 FINDINGS: Chronic cardiomegaly. Lower mediastinal widening from hiatal hernia, better seen previously. Negative aortic contours. Interstitial coarsening likely from low volumes. No edema, pneumonia, effusion, or pneumothorax. No appreciable fracture. IMPRESSION: Cardiomegaly without failure. Electronically Signed   By: Marnee Spring M.D.   On: 10/22/2015 01:01   Dg Knee Left Port  10/22/2015  CLINICAL DATA:  Pain following fall EXAM: PORTABLE LEFT KNEE - 1-2 VIEW COMPARISON:  October 21, 2015 FINDINGS: Frontal and lateral views were obtained. There is a total knee replacement with femoral and tibial prosthetic components appearing well-seated. No acute fracture or dislocation evident. No joint effusion. No erosive change. There is arterial vascular calcification in the superficial femoral artery. IMPRESSION:  Prosthetic components appear well seated. No fracture or dislocation. No joint effusion. There is arterial vascular calcification present. Electronically Signed   By: Bretta Bang III M.D.   On: 10/22/2015 08:22   Dg C-arm 1-60 Min-no Report  10/22/2015  CLINICAL DATA: surgery C-ARM 1-60 MINUTES Fluoroscopy was utilized by the requesting physician.  No radiographic interpretation.   Dg Hip Port Unilat With Pelvis 1v Left  10/23/2015  CLINICAL DATA:  Intraoperative radiograph for left femoral intramedullary nail placement. Initial encounter. EXAM: DG HIP (WITH OR WITHOUT PELVIS) 1V PORT LEFT COMPARISON:  Left hip radiographs performed 10/21/2015 FINDINGS: Four fluoroscopic C-arm images are provided from the OR, demonstrating placement of an intramedullary rod and screws across the patient's left femoral intertrochanteric fracture, transfixing the fracture in grossly anatomic alignment. The left femoral head remains seated at the acetabulum. The patient's left total knee arthroplasty is grossly unremarkable in appearance. IMPRESSION: Status post internal fixation of left femoral intertrochanteric fracture in grossly anatomic alignment. Electronically Signed   By: Roanna Raider M.D.   On: 10/23/2015 04:03   Dg Hip Unilat With Pelvis 2-3 Views Left  10/22/2015  CLINICAL DATA:  Fall with left hip deformity.  Initial encounter. EXAM: DG HIP (WITH OR WITHOUT PELVIS) 2-3V LEFT COMPARISON:  None. FINDINGS: Acute intertrochanteric left femur fracture with varus angulation from medial impaction. Located hips. No evidence of pelvic ring fracture or diastasis. Osteopenia and atherosclerosis. IMPRESSION: Displaced intertrochanteric left femur fracture. Electronically Signed   By: Marnee Spring M.D.   On: 10/22/2015 00:34   Dg Femur  Min 2 Views Left  10/22/2015  CLINICAL DATA:  Fall with left hip deformity.  Initial encounter. EXAM: LEFT FEMUR 2 VIEWS COMPARISON:  None. FINDINGS: An intertrochanteric femur  fracture is described on contemporaneous hip radiography. No distal fracture is seen. Status post total knee arthroplasty with partial visualization of the tibial component. Osteopenia. IMPRESSION: Displaced intertrochanteric left femur fracture. Electronically Signed   By: Marnee Spring M.D.   On: 10/22/2015 00:39   Dg Femur Port Min 2 Views Left  10/23/2015  CLINICAL DATA:  Postoperative radiograph for left femoral internal fixation. Initial encounter. EXAM: LEFT FEMUR PORTABLE 2 VIEWS COMPARISON:  Left femoral radiographs performed 10/21/2015 FINDINGS: There has been interval placement of an intramedullary rod and screws within the left femur, transfixing the intertrochanteric fracture in grossly anatomic alignment. No new fractures are seen. The patient's left knee total arthroplasty is grossly unremarkable in appearance. No significant knee joint effusion is seen. Scattered postoperative soft tissue air is noted. The left femoral head remains seated at the acetabulum. IMPRESSION: Status post internal fixation of left femoral intertrochanteric fracture in grossly anatomic alignment. Electronically Signed   By: Roanna Raider M.D.   On: 10/23/2015 00:23    Time Spent in minutes 25  GHIMIRE,SHANKER on 10/24/2015 at 10:20 AM

## 2015-10-24 NOTE — Progress Notes (Signed)
Physical Therapy Treatment Patient Details Name: Laura Lowe MRN: [id number] DOB: June 18, 1919 Today's Date: 10/24/2015    History of Present Illness Pt s/p fall with L hip fx and IM nailing repair.  Pt with hx of L TKR, glaucoma, hearing deficits and significant dementia    PT Comments    Pt tolerated increased activity today, she ambulated 5' with RW and mod assist to advance LLE and frequent verbal/manual cues for sequencing. Performed LLE exercises with assist.   Follow Up Recommendations  SNF     Equipment Recommendations  None recommended by PT    Recommendations for Other Services OT consult     Precautions / Restrictions Restrictions Weight Bearing Restrictions: Yes LLE Weight Bearing: Partial weight bearing LLE Partial Weight Bearing Percentage or Pounds: 50%    Mobility  Bed Mobility Overal bed mobility: Needs Assistance;+2 for physical assistance;+ 2 for safety/equipment Bed Mobility: Supine to Sit     Supine to sit: Max assist;+2 for physical assistance;+2 for safety/equipment     General bed mobility comments: Pt following min cues.  Pad utilized to bring pt to EOB.   Transfers Overall transfer level: Needs assistance Equipment used: Rolling walker (2 wheeled)   Sit to Stand: Mod assist;+2 physical assistance;+2 safety/equipment         General transfer comment: Multimodal cues for use of UEs to self assist.  Physical assist to rise/steady.   Ambulation/Gait Ambulation/Gait assistance: Mod assist Ambulation Distance (Feet): 5 Feet Assistive device: Rolling walker (2 wheeled) Gait Pattern/deviations: Trunk flexed;Step-to pattern;Decreased step length - right;Decreased step length - left   Gait velocity interpretation: Below normal speed for age/gender General Gait Details: assist to advance LLE, distance limited by pain, frequent verbal/manual cues for sequencing and for WB status, pt very HOH, increased time for all mobility   Stairs            Wheelchair Mobility    Modified Rankin (Stroke Patients Only)       Balance     Sitting balance-Leahy Scale: Fair       Standing balance-Leahy Scale: Poor                      Cognition Arousal/Alertness: Awake/alert Behavior During Therapy: WFL for tasks assessed/performed Overall Cognitive Status: History of cognitive impairments - at baseline                      Exercises General Exercises - Lower Extremity Short Arc QuadBarbaraann Lowe;Left;10 reps;Supine Heel Slides: AAROM;Left;10 reps;Supine Hip ABduction/ADduction: AAROM;Left;10 reps;Supine    General Comments        Pertinent Vitals/Pain Faces Pain Scale: Hurts even more Pain Location: LLE with activity Pain Intervention(s): Monitored during session;Limited activity within patient's tolerance;Ice applied    Home Living                      Prior Function            PT Goals (current goals can now be found in the care plan section) Acute Rehab PT Goals Patient Stated Goal: Family expresses goal of dc to rehab setting to regain level of IND prior to return home PT Goal Formulation: With patient/family Time For Goal Achievement: 10/30/15 Potential to Achieve Goals: Fair Progress towards PT goals: Progressing toward goals    Frequency  Min 3X/week    PT Plan Current plan remains appropriate    Co-evaluation  End of Session Equipment Utilized During Treatment: Gait belt Activity Tolerance: Patient limited by pain;Other (comment) (very HOH) Patient left: in chair;with call bell/phone within reach;with chair alarm set     Time: 1001-1017 PT Time Calculation (min) (ACUTE ONLY): 16 min  Charges:  $Gait Training: 8-22 mins                    G Codes:      Laura Lowe, Laura Lowe 10/24/2015, 10:27 AM [phone]

## 2015-10-24 NOTE — Anesthesia Postprocedure Evaluation (Signed)
Anesthesia Post Note  Patient: Laura CircleCharlotte A Lowe  Procedure(s) Performed: Procedure(s) (LRB): INTRAMEDULLARY (IM) NAIL FEMORAL LEFT (Left)  Patient location during evaluation: PACU Anesthesia Type: Spinal Level of consciousness: awake Pain management: pain level controlled Vital Signs Assessment: post-procedure vital signs reviewed and stable Respiratory status: spontaneous breathing Cardiovascular status: stable Postop Assessment: spinal receding Anesthetic complications: no    Last Vitals:  Filed Vitals:   10/23/15 2045 10/24/15 0501  BP: 143/63 128/46  Pulse: 87 66  Temp: 36.6 C 36.7 C  Resp: 16 14    Last Pain:  Filed Vitals:   10/24/15 0726  PainSc: Asleep                 Antaniya Venuti

## 2015-10-25 ENCOUNTER — Encounter (HOSPITAL_COMMUNITY): Payer: Self-pay | Admitting: Orthopedic Surgery

## 2015-10-25 DIAGNOSIS — M25552 Pain in left hip: Secondary | ICD-10-CM | POA: Diagnosis not present

## 2015-10-25 DIAGNOSIS — R29898 Other symptoms and signs involving the musculoskeletal system: Secondary | ICD-10-CM | POA: Diagnosis not present

## 2015-10-25 DIAGNOSIS — F0390 Unspecified dementia without behavioral disturbance: Secondary | ICD-10-CM | POA: Diagnosis not present

## 2015-10-25 DIAGNOSIS — R911 Solitary pulmonary nodule: Secondary | ICD-10-CM | POA: Diagnosis not present

## 2015-10-25 DIAGNOSIS — S72002D Fracture of unspecified part of neck of left femur, subsequent encounter for closed fracture with routine healing: Secondary | ICD-10-CM | POA: Diagnosis not present

## 2015-10-25 DIAGNOSIS — H9190 Unspecified hearing loss, unspecified ear: Secondary | ICD-10-CM | POA: Diagnosis not present

## 2015-10-25 DIAGNOSIS — S72009A Fracture of unspecified part of neck of unspecified femur, initial encounter for closed fracture: Secondary | ICD-10-CM | POA: Diagnosis not present

## 2015-10-25 DIAGNOSIS — R278 Other lack of coordination: Secondary | ICD-10-CM | POA: Diagnosis not present

## 2015-10-25 DIAGNOSIS — R451 Restlessness and agitation: Secondary | ICD-10-CM | POA: Diagnosis not present

## 2015-10-25 DIAGNOSIS — M6281 Muscle weakness (generalized): Secondary | ICD-10-CM | POA: Diagnosis not present

## 2015-10-25 DIAGNOSIS — Z4789 Encounter for other orthopedic aftercare: Secondary | ICD-10-CM | POA: Diagnosis not present

## 2015-10-25 DIAGNOSIS — M1990 Unspecified osteoarthritis, unspecified site: Secondary | ICD-10-CM | POA: Diagnosis not present

## 2015-10-25 DIAGNOSIS — R400 Somnolence: Secondary | ICD-10-CM | POA: Diagnosis not present

## 2015-10-25 DIAGNOSIS — G309 Alzheimer's disease, unspecified: Secondary | ICD-10-CM | POA: Diagnosis not present

## 2015-10-25 DIAGNOSIS — R41841 Cognitive communication deficit: Secondary | ICD-10-CM | POA: Diagnosis not present

## 2015-10-25 DIAGNOSIS — R0989 Other specified symptoms and signs involving the circulatory and respiratory systems: Secondary | ICD-10-CM | POA: Diagnosis not present

## 2015-10-25 DIAGNOSIS — E559 Vitamin D deficiency, unspecified: Secondary | ICD-10-CM | POA: Diagnosis not present

## 2015-10-25 DIAGNOSIS — F0280 Dementia in other diseases classified elsewhere without behavioral disturbance: Secondary | ICD-10-CM | POA: Diagnosis not present

## 2015-10-25 DIAGNOSIS — E039 Hypothyroidism, unspecified: Secondary | ICD-10-CM | POA: Diagnosis not present

## 2015-10-25 DIAGNOSIS — S72142D Displaced intertrochanteric fracture of left femur, subsequent encounter for closed fracture with routine healing: Secondary | ICD-10-CM | POA: Diagnosis not present

## 2015-10-25 DIAGNOSIS — I10 Essential (primary) hypertension: Secondary | ICD-10-CM | POA: Diagnosis not present

## 2015-10-25 DIAGNOSIS — Z5189 Encounter for other specified aftercare: Secondary | ICD-10-CM | POA: Diagnosis not present

## 2015-10-25 DIAGNOSIS — H409 Unspecified glaucoma: Secondary | ICD-10-CM | POA: Diagnosis not present

## 2015-10-25 DIAGNOSIS — S72002A Fracture of unspecified part of neck of left femur, initial encounter for closed fracture: Secondary | ICD-10-CM | POA: Diagnosis not present

## 2015-10-25 LAB — TYPE AND SCREEN
ABO/RH(D): O POS
Antibody Screen: NEGATIVE
Unit division: 0

## 2015-10-25 LAB — CBC
HEMATOCRIT: 26.8 % — AB (ref 36.0–46.0)
HEMOGLOBIN: 8.6 g/dL — AB (ref 12.0–15.0)
MCH: 29.5 pg (ref 26.0–34.0)
MCHC: 32.1 g/dL (ref 30.0–36.0)
MCV: 91.8 fL (ref 78.0–100.0)
Platelets: 166 10*3/uL (ref 150–400)
RBC: 2.92 MIL/uL — ABNORMAL LOW (ref 3.87–5.11)
RDW: 14.8 % (ref 11.5–15.5)
WBC: 9.8 10*3/uL (ref 4.0–10.5)

## 2015-10-25 MED ORDER — POLYETHYLENE GLYCOL 3350 17 G PO PACK: 17.0000 g | PACK | Freq: Every day | ORAL | Status: AC

## 2015-10-25 MED ORDER — DIAZEPAM 5 MG PO TABS: 5.0000 mg | ORAL_TABLET | Freq: Two times a day (BID) | ORAL | Status: DC | PRN

## 2015-10-25 MED ORDER — ENOXAPARIN SODIUM 40 MG/0.4ML ~~LOC~~ SOLN: 40.0000 mg | SUBCUTANEOUS | Status: DC

## 2015-10-25 MED ORDER — ACETAMINOPHEN 500 MG PO TABS: 1000.0000 mg | ORAL_TABLET | Freq: Three times a day (TID) | ORAL | Status: AC

## 2015-10-25 NOTE — Progress Notes (Signed)
10/25/15 Nursing 0500 Patient voided 50 cc at 8pm last night. Bladder scanned at that time for 17cc urine. Patient unable to void since that time. Bladder scanned at 4 am for 47 cc urine. Will continue to monitor patient.

## 2015-10-25 NOTE — Progress Notes (Signed)
   Subjective:  Patient reports pain as mild to moderate.  No events.  Objective:   VITALS:   Filed Vitals:   10/24/15 0501 10/24/15 1404 10/24/15 2140 10/25/15 0445  BP: 128/46 133/49 124/67 124/82  Pulse: 66 95 76 64  Temp: 98 F (36.7 C) 99.1 F (37.3 C) 97.7 F (36.5 C) 98.6 F (37 C)  TempSrc: Axillary Oral Oral Oral  Resp: 14 16 14 13   Height:      Weight:      SpO2: 96% 91% 96% 93%    ABD soft Sensation intact distally Intact pulses distally Dorsiflexion/Plantar flexion intact Incision: dressing C/D/I Compartment soft   Lab Results  Component Value Date   WBC 9.8 10/25/2015   HGB 8.6* 10/25/2015   HCT 26.8* 10/25/2015   MCV 91.8 10/25/2015   PLT 166 10/25/2015   BMET    Component Value Date/Time   NA 140 10/23/2015 0343   K 3.7 10/23/2015 0343   CL 109 10/23/2015 0343   CO2 26 10/23/2015 0343   GLUCOSE 123* 10/23/2015 0343   BUN 16 10/23/2015 0343   CREATININE 0.75 10/23/2015 0343   CREATININE 1.67* 05/07/2015 1108   CALCIUM 8.0* 10/23/2015 0343   GFRNONAA >60 10/23/2015 0343   GFRNONAA 26* 05/07/2015 1108   GFRAA >60 10/23/2015 0343   GFRAA 30* 05/07/2015 1108     Assessment/Plan: 3 Days Post-Op   Principal Problem:   Closed left hip fracture (HCC) Active Problems:   Dementia   Hypothyroidism   Essential hypertension   Hip fracture (HCC)   Fracture, intertrochanteric, left femur (HCC)    50% WB LLE with walker DVT ppx: lovenox x30 days, SCDs, TEDs PO pain control ABLA on chronic anemia: stable, monitor PT/OT Dispo: SNF placement   Laura Lowe, Laura ReamsBrian Lowe 10/25/2015, 7:01 AM   Laura FredericBrian Lowe Hankerson, MD Cell [phone]

## 2015-10-25 NOTE — Progress Notes (Signed)
Disposition:LCSWA spoke with [facility] about patient disposition to facility. Attempted to meet with patient at beside, patient sleeping at the time.  Called patient daughter and informed about Disposition, she is agreeable at this time.  LCSWA informed nurses, faxed DC summary.Completed transport form, DNR,Prescriptions placed.  Placement note completed. [facility] for transport.  Vivi BarrackNicole Emeril Stille, Theresia MajorsLCSWA, MSW Clinical Social Worker 5E and Psychiatric Service Line [phone] 10/25/2015  10:17 AM

## 2015-10-25 NOTE — Clinical Social Work Placement (Signed)
   CLINICAL SOCIAL WORK PLACEMENT  NOTE  Date:  10/25/2015  Patient Details  Name: Laura Lowe MRN: [id number] Date of Birth: 03/31/1920  Clinical Social Work is seeking post-discharge placement for this patient at the Skilled  Nursing Facility level of care (*CSW will initial, date and re-position this form in  chart as items are completed):  Yes   Patient/family provided with [place] Clinical Social Work Department's list of facilities offering this level of care within the geographic area requested by the patient (or if unable, by the patient's family).  Yes   Patient/family informed of their freedom to choose among providers that offer the needed level of care, that participate in Medicare, Medicaid or managed care program needed by the patient, have an available bed and are willing to accept the patient.  Yes   Patient/family informed of [facility]'s ownership interest [facility] and [facility], as well as of the fact that they are under no obligation to receive care at these facilities.  PASRR submitted to EDS on 10/23/15     PASRR number received on 10/23/15     Existing PASRR number confirmed on       FL2 transmitted to all facilities in geographic area requested by pt/family on 10/23/15     FL2 transmitted to all facilities within larger geographic area on       Patient informed that his/her managed care company has contracts with or will negotiate with certain facilities, including the following:            Patient/family informed of bed offers received.  [facility]     Physician recommends and patient chooses bed at      [facility] on 10/25/15.  Patient to be transferred to facility by [facility]     Patient family notified on 10/25/15 of transfer.  Name of family member notified:  Laura Lowe     PHYSICIAN       Additional Comment:     _______________________________________________ Clearance CootsNicole A Zana Biancardi, LCSW 10/25/2015, 10:21 AM

## 2015-10-25 NOTE — Discharge Summary (Signed)
PATIENT DETAILS Name: Laura Lowe Age: 82 y.o. Sex: female Date of Birth: [birth year] MRN: [id number]. Admitting Physician: Eduard Clos, MD WUJ:[id number],NWGN J, MD  Admit Date: 10/21/2015 Discharge date: 10/25/2015  Recommendations for Outpatient Follow-up:  1. Ensure follow-up with orthopedics  2. Please repeat CBC/BMET in 1 week 3. 4 mm lung nodule in RUL,follow up as outpatient with PCP-defer to PCP whether it is worth pursuing this as patient is 82 years old.   PRIMARY DISCHARGE DIAGNOSIS:  Principal Problem:   Closed left hip fracture (HCC) Active Problems:   Dementia   Hypothyroidism   Essential hypertension   Hip fracture (HCC)   Fracture, intertrochanteric, left femur (HCC)      PAST MEDICAL HISTORY: Past Medical History  Diagnosis Date  . Glaucoma   . Arthritis   . Hypertension   . Osteoporosis   . Allergy   . Counseling for estrogen replacement therapy   . IBS (irritable bowel syndrome)   . Anxiety   . Depression   . HSV-1 (herpes simplex virus 1) infection   . Carotid bruit   . Hearing loss   . Vitamin D deficiency   . Dementia     DISCHARGE MEDICATIONS: Current Discharge Medication List    START taking these medications   Details  acetaminophen (TYLENOL) 500 MG tablet Take 2 tablets (1,000 mg total) by mouth every 8 (eight) hours.    enoxaparin (LOVENOX) 40 MG/0.4ML injection Inject 0.4 mLs (40 mg total) into the skin daily. 30 days from 10/22/15 and then stop Qty: 14 Syringe, Refills: 0    oxyCODONE (OXY IR/ROXICODONE) 5 MG immediate release tablet Take 1 tablet (5 mg total) by mouth every 4 (four) hours as needed for moderate pain. Qty: 60 tablet, Refills: 0    polyethylene glycol (MIRALAX / GLYCOLAX) packet Take 17 g by mouth daily.      CONTINUE these medications which have CHANGED   Details  diazepam (VALIUM) 5 MG tablet Take 1 tablet (5 mg total) by mouth 2 (two) times daily as needed. for anxiety Qty: 20 tablet, Refills:  0      CONTINUE these medications which have NOT CHANGED   Details  albuterol (PROVENTIL HFA;VENTOLIN HFA) 108 (90 BASE) MCG/ACT inhaler Inhale 2 puffs into the lungs every 6 (six) hours as needed for wheezing or shortness of breath. Qty: 1 Inhaler, Refills: 2   Associated Diagnoses: Acute upper respiratory infection    Ferrous Sulfate 250 MG CPCR Take 250 mg by mouth daily.     fluticasone (FLONASE) 50 MCG/ACT nasal spray Place 2 sprays into the nose daily as needed for allergies.     levothyroxine (SYNTHROID, LEVOTHROID) 50 MCG tablet Take 1 tablet (50 mcg total) by mouth daily. Qty: 90 tablet, Refills: 1    Melatonin 3 MG CAPS Take 3 mg by mouth at bedtime.    memantine (NAMENDA) 10 MG tablet TAKE 1 TABLET BY MOUTH TWICE DAILY Qty: 60 tablet, Refills: 5    OLANZapine (ZYPREXA) 10 MG tablet Take 1 tablet (10 mg total) by mouth at bedtime. Qty: 90 tablet, Refills: 1    PARoxetine (PAXIL) 10 MG tablet TAKE 1 TABLET BY MOUTH EVERY DAY Qty: 90 tablet, Refills: 0    ramipril (ALTACE) 10 MG capsule TAKE ONE CAPSULE BY MOUTH DAILY Qty: 90 capsule, Refills: 1    TRAVATAN Z 0.004 % SOLN ophthalmic solution Place 1 drop into both eyes at bedtime.     Spacer/Aero-Holding Chambers (AEROCHAMBER PLUS WITH MASK)  inhaler Use as instructed Qty: 1 each, Refills: 0   Associated Diagnoses: Acute upper respiratory infection      STOP taking these medications     naproxen sodium (ANAPROX) 220 MG tablet      memantine (NAMENDA XR) 28 MG CP24 24 hr capsule         ALLERGIES:   Allergies  Allergen Reactions  . Risedronate Sodium Other (See Comments)    GI    BRIEF HPI:  See H&P, Labs, Consult and Test reports for all details in brief, 82 y.o Female with a PMH of dementia, HTN, hypothyroidism presented to the ED for a unwitnessed fall and left leg pain. X-ray showing Left hip fracture. CT head and neck unremarkable except for lung nodule. [name] was consulted, Underwent intramedullary  nailing on 10-24-22.  CONSULTATIONS:   orthopedic surgery  PERTINENT RADIOLOGIC STUDIES: Dg Elbow Complete Left  Oct 24, 2015  CLINICAL DATA:  Fall with hematoma to the left elbow. Initial encounter. EXAM: LEFT ELBOW - COMPLETE 3+ VIEW COMPARISON:  None. FINDINGS: Soft tissue swelling over the olecranon process correlating with history. No joint effusion, fracture, or dislocation. IMPRESSION: Soft tissue swelling without acute osseous finding. Electronically Signed   By: Marnee Spring M.D.   On: 2015/10/24 00:35   Dg Forearm Left  10/24/2015  CLINICAL DATA:  Fall with swelling to the olecranon process. Initial encounter. EXAM: LEFT FOREARM - 2 VIEW COMPARISON:  08/20/2010 wrist radiography FINDINGS: Soft tissue swelling over the olecranon process. Negative for acute fracture or dislocation. There is a remote distal radius fracture with healed malalignment. Remote ulnar styloid process fracture. Osteopenia. First CMC osteoarthritis. IMPRESSION: 1. No acute osseous finding. 2. Remote distal radius and ulnar fractures. Electronically Signed   By: Marnee Spring M.D.   On: 10-24-15 00:37   Dg Knee 1-2 Views Left  10-24-15  CLINICAL DATA:  Initial evaluation for acute trauma, fall. EXAM: LEFT KNEE - 1-2 VIEW COMPARISON:  None. FINDINGS: Cemented total knee arthroplasty in place. No periprosthetic fracture. No periprosthetic lucency to suggest loosening or infection. No other acute osseous abnormality about the knee. Osteopenia. No soft tissue abnormality. IMPRESSION: 1. No acute fracture or dislocation. 2. Left total knee arthroplasty in place without complication. 3. Osteopenia. Electronically Signed   By: Rise Mu M.D.   On: 10-24-2015 00:40   Ct Head Wo Contrast  10/24/2015  CLINICAL DATA:  Initial evaluation for acute trauma, unwitnessed fall. EXAM: CT HEAD WITHOUT CONTRAST CT CERVICAL SPINE WITHOUT CONTRAST TECHNIQUE: Multidetector CT imaging of the head and cervical spine was performed  following the standard protocol without intravenous contrast. Multiplanar CT image reconstructions of the cervical spine were also generated. COMPARISON:  Prior CT from 08/20/2010. FINDINGS: CT HEAD FINDINGS Scalp soft tissues demonstrate no acute abnormality. No acute abnormality about the orbits. Paranasal sinuses are clear.  No mastoid effusion. Calvarium intact. Scattered vascular calcifications within the carotid siphons. Age-related cerebral atrophy with mild chronic small vessel ischemic disease. No acute intracranial hemorrhage or large vessel territory infarct. No mass lesion, midline shift, or mass effect. No hydrocephalus. No extra-axial fluid collection. CT CERVICAL SPINE FINDINGS Reversal of the normal cervical lordosis with apex at C5. Trace anterolisthesis of C3 on C4 and C4 on C5. Vertebral body heights are preserved. Normal C1-2 articulations are intact. No prevertebral soft tissue swelling. No acute fracture or listhesis. Incomplete fusion of the posterior ring of C1 noted. Moderate to advanced degenerative spondylolysis at C4-5, C5-6, and C6-7. Multilevel facet arthrosis within the  upper cervical spine. Visualized soft tissues of the neck are within normal limits. Visualized lung apices are clear without evidence of apical pneumothorax. 4 mm nodule within the right upper lobe (series 7, image 65). IMPRESSION: CT BRAIN: 1. No acute intracranial process. 2. Generalized cerebral atrophy with mild chronic microvascular ischemic disease, stable. CT CERVICAL SPINE: 1. No acute traumatic injury within cervical spine. 2. Reversal of the normal cervical lordosis with advanced degenerative spondylolysis at C4-5, C5-6, and C6-7, stable. 3. 4 mm right upper lobe nodule, indeterminate. No follow-up needed if patient is low-risk. Non-contrast chest CT can be considered in 12 months if patient is high-risk. This recommendation follows the consensus statement: Guidelines for Management of Incidental Pulmonary  Nodules Detected on CT Images:From the Fleischner Society 2017; published online before print (10.1148/[id number]). Electronically Signed   By: Rise Mu M.D.   On: 10/22/2015 01:42   Ct Cervical Spine Wo Contrast  10/22/2015  CLINICAL DATA:  Initial evaluation for acute trauma, unwitnessed fall. EXAM: CT HEAD WITHOUT CONTRAST CT CERVICAL SPINE WITHOUT CONTRAST TECHNIQUE: Multidetector CT imaging of the head and cervical spine was performed following the standard protocol without intravenous contrast. Multiplanar CT image reconstructions of the cervical spine were also generated. COMPARISON:  Prior CT from 08/20/2010. FINDINGS: CT HEAD FINDINGS Scalp soft tissues demonstrate no acute abnormality. No acute abnormality about the orbits. Paranasal sinuses are clear.  No mastoid effusion. Calvarium intact. Scattered vascular calcifications within the carotid siphons. Age-related cerebral atrophy with mild chronic small vessel ischemic disease. No acute intracranial hemorrhage or large vessel territory infarct. No mass lesion, midline shift, or mass effect. No hydrocephalus. No extra-axial fluid collection. CT CERVICAL SPINE FINDINGS Reversal of the normal cervical lordosis with apex at C5. Trace anterolisthesis of C3 on C4 and C4 on C5. Vertebral body heights are preserved. Normal C1-2 articulations are intact. No prevertebral soft tissue swelling. No acute fracture or listhesis. Incomplete fusion of the posterior ring of C1 noted. Moderate to advanced degenerative spondylolysis at C4-5, C5-6, and C6-7. Multilevel facet arthrosis within the upper cervical spine. Visualized soft tissues of the neck are within normal limits. Visualized lung apices are clear without evidence of apical pneumothorax. 4 mm nodule within the right upper lobe (series 7, image 65). IMPRESSION: CT BRAIN: 1. No acute intracranial process. 2. Generalized cerebral atrophy with mild chronic microvascular ischemic disease, stable.  CT CERVICAL SPINE: 1. No acute traumatic injury within cervical spine. 2. Reversal of the normal cervical lordosis with advanced degenerative spondylolysis at C4-5, C5-6, and C6-7, stable. 3. 4 mm right upper lobe nodule, indeterminate. No follow-up needed if patient is low-risk. Non-contrast chest CT can be considered in 12 months if patient is high-risk. This recommendation follows the consensus statement: Guidelines for Management of Incidental Pulmonary Nodules Detected on CT Images:From the Fleischner Society 2017; published online before print (10.1148/[id number]). Electronically Signed   By: Rise Mu M.D.   On: 10/22/2015 01:42   Pelvis Portable  10/23/2015  CLINICAL DATA:  Postoperative radiograph status post internal fixation of left femoral fracture. Initial encounter. EXAM: PORTABLE PELVIS 1-2 VIEWS COMPARISON:  Left hip radiographs performed 10/21/2015 FINDINGS: There has been placement of an intramedullary rod and screw, transfixing the patient's left femoral intertrochanteric fracture in grossly anatomic alignment. The distal aspect of the rod and screw are imaged on concurrent left femur radiographs. The left femoral head remains seated at the acetabulum. The right hip joint is grossly unremarkable. Scattered vascular calcifications are seen. IMPRESSION: Status post internal fixation  of left femoral intertrochanteric fracture in grossly anatomic alignment. Electronically Signed   By: Roanna RaiderJeffery  Chang M.D.   On: 10/23/2015 00:32   Dg Chest Port 1 View  10/22/2015  CLINICAL DATA:  Preop for hip fracture. EXAM: PORTABLE CHEST 1 VIEW COMPARISON:  07/02/2014 FINDINGS: Chronic cardiomegaly. Lower mediastinal widening from hiatal hernia, better seen previously. Negative aortic contours. Interstitial coarsening likely from low volumes. No edema, pneumonia, effusion, or pneumothorax. No appreciable fracture. IMPRESSION: Cardiomegaly without failure. Electronically Signed   By: Marnee SpringJonathon   Watts M.D.   On: 10/22/2015 01:01   Dg Knee Left Port  10/22/2015  CLINICAL DATA:  Pain following fall EXAM: PORTABLE LEFT KNEE - 1-2 VIEW COMPARISON:  October 21, 2015 FINDINGS: Frontal and lateral views were obtained. There is a total knee replacement with femoral and tibial prosthetic components appearing well-seated. No acute fracture or dislocation evident. No joint effusion. No erosive change. There is arterial vascular calcification in the superficial femoral artery. IMPRESSION: Prosthetic components appear well seated. No fracture or dislocation. No joint effusion. There is arterial vascular calcification present. Electronically Signed   By: Bretta BangWilliam  Woodruff III M.D.   On: 10/22/2015 08:22   Dg C-arm 1-60 Min-no Report  10/22/2015  CLINICAL DATA: surgery C-ARM 1-60 MINUTES Fluoroscopy was utilized by the requesting physician.  No radiographic interpretation.   Dg Hip Port Unilat With Pelvis 1v Left  10/23/2015  CLINICAL DATA:  Intraoperative radiograph for left femoral intramedullary nail placement. Initial encounter. EXAM: DG HIP (WITH OR WITHOUT PELVIS) 1V PORT LEFT COMPARISON:  Left hip radiographs performed 10/21/2015 FINDINGS: Four fluoroscopic C-arm images are provided from the OR, demonstrating placement of an intramedullary rod and screws across the patient's left femoral intertrochanteric fracture, transfixing the fracture in grossly anatomic alignment. The left femoral head remains seated at the acetabulum. The patient's left total knee arthroplasty is grossly unremarkable in appearance. IMPRESSION: Status post internal fixation of left femoral intertrochanteric fracture in grossly anatomic alignment. Electronically Signed   By: Roanna RaiderJeffery  Chang M.D.   On: 10/23/2015 04:03   Dg Hip Unilat With Pelvis 2-3 Views Left  10/22/2015  CLINICAL DATA:  Fall with left hip deformity.  Initial encounter. EXAM: DG HIP (WITH OR WITHOUT PELVIS) 2-3V LEFT COMPARISON:  None. FINDINGS: Acute intertrochanteric  left femur fracture with varus angulation from medial impaction. Located hips. No evidence of pelvic ring fracture or diastasis. Osteopenia and atherosclerosis. IMPRESSION: Displaced intertrochanteric left femur fracture. Electronically Signed   By: Marnee SpringJonathon  Watts M.D.   On: 10/22/2015 00:34   Dg Femur Min 2 Views Left  10/22/2015  CLINICAL DATA:  Fall with left hip deformity.  Initial encounter. EXAM: LEFT FEMUR 2 VIEWS COMPARISON:  None. FINDINGS: An intertrochanteric femur fracture is described on contemporaneous hip radiography. No distal fracture is seen. Status post total knee arthroplasty with partial visualization of the tibial component. Osteopenia. IMPRESSION: Displaced intertrochanteric left femur fracture. Electronically Signed   By: Marnee SpringJonathon  Watts M.D.   On: 10/22/2015 00:39   Dg Femur Port Min 2 Views Left  10/23/2015  CLINICAL DATA:  Postoperative radiograph for left femoral internal fixation. Initial encounter. EXAM: LEFT FEMUR PORTABLE 2 VIEWS COMPARISON:  Left femoral radiographs performed 10/21/2015 FINDINGS: There has been interval placement of an intramedullary rod and screws within the left femur, transfixing the intertrochanteric fracture in grossly anatomic alignment. No new fractures are seen. The patient's left knee total arthroplasty is grossly unremarkable in appearance. No significant knee joint effusion is seen. Scattered postoperative soft tissue  air is noted. The left femoral head remains seated at the acetabulum. IMPRESSION: Status post internal fixation of left femoral intertrochanteric fracture in grossly anatomic alignment. Electronically Signed   By: Roanna Raider M.D.   On: 10/23/2015 00:23     PERTINENT LAB RESULTS: CBC:  Recent Labs  10/24/15 0421 10/25/15 0339  WBC 10.2 9.8  HGB 9.5* 8.6*  HCT 28.9* 26.8*  PLT 129* 166   CMET CMP     Component Value Date/Time   NA 140 10/23/2015 0343   K 3.7 10/23/2015 0343   CL 109 10/23/2015 0343   CO2 26  10/23/2015 0343   GLUCOSE 123* 10/23/2015 0343   BUN 16 10/23/2015 0343   CREATININE 0.75 10/23/2015 0343   CREATININE 1.67* 05/07/2015 1108   CALCIUM 8.0* 10/23/2015 0343   PROT 5.9* 10/22/2015 0533   ALBUMIN 3.5 10/22/2015 0533   AST 20 10/22/2015 0533   ALT 12* 10/22/2015 0533   ALKPHOS 57 10/22/2015 0533   BILITOT 0.6 10/22/2015 0533   GFRNONAA >60 10/23/2015 0343   GFRNONAA 26* 05/07/2015 1108   GFRAA >60 10/23/2015 0343   GFRAA 30* 05/07/2015 1108    GFR Estimated Creatinine Clearance: 29.3 mL/min (by C-G formula based on Cr of 0.75). No results for input(s): LIPASE, AMYLASE in the last 72 hours. No results for input(s): CKTOTAL, CKMB, CKMBINDEX, TROPONINI in the last 72 hours. Invalid input(s): POCBNP No results for input(s): DDIMER in the last 72 hours. No results for input(s): HGBA1C in the last 72 hours. No results for input(s): CHOL, HDL, LDLCALC, TRIG, CHOLHDL, LDLDIRECT in the last 72 hours. No results for input(s): TSH, T4TOTAL, T3FREE, THYROIDAB in the last 72 hours.  Invalid input(s): FREET3 No results for input(s): VITAMINB12, FOLATE, FERRITIN, TIBC, IRON, RETICCTPCT in the last 72 hours. Coags: No results for input(s): INR in the last 72 hours.  Invalid input(s): PT Microbiology: Recent Results (from the past 240 hour(s))  Surgical pcr screen     Status: None   Collection Time: 10/22/15  5:51 AM  Result Value Ref Range Status   MRSA, PCR NEGATIVE NEGATIVE Final   Staphylococcus aureus NEGATIVE NEGATIVE Final    Comment:        The Xpert SA Assay (FDA approved for NASAL specimens in patients over 48 years of age), is one component of a comprehensive surveillance program.  Test performance has been validated by [facility] for patients greater than or equal to 6 year old. It is not intended to diagnose infection nor to guide or monitor treatment.   Urine culture     Status: None   Collection Time: 10/22/15  5:54 AM  Result Value Ref Range  Status   Specimen Description URINE, CLEAN CATCH  Final   Special Requests NONE  Final   Culture NO GROWTH Performed at [facility]   Final   Report Status 10/23/2015 FINAL  Final     BRIEF HOSPITAL COURSE:  Displaced left intertrochanteric hip fracture: Secondary to mechanical fall. Ortho consulted, underwent intramedullary nail placement on 6/9. Recommendations from orthopedics are to continue Lovenox for 30 days, 50% weightbearing to left lower extremity with walker.   Anemia: Multifactorial- Secondary to perioperative blood loss-but has chronic iron deficiency at baseline, transfused 1 unit of PRBC this admission. Hemoglobin stable at 8.6, please continue to follow closely in the outpatient setting  Hypertension:controlled, continue ramipril. Follow  Leukocytosis: likely reactive, Chest x-ray and UA are negative. Leukocytosis has resolved. Continue to monitor off antibiotics  Hypothyroidism: Continue Levothyroxine.   Dementia: Suspect close to her usual baseline. Continue Zyprexa, Paxil and Namenda.   4 mm lung nodule in RUL: Follow up as outpatient with PCP-defer to PCP whether it is worth pursuing this as patient is 82 years old.  TODAY-DAY OF DISCHARGE:  Subjective:   Dalayah Deahl today Remains pleasantly confused. However she is in no distress and is lying comfortably in bed.  Objective:   Blood pressure 124/82, pulse 64, temperature 98.6 F (37 C), temperature source Oral, resp. rate 13, height 4\' 7"  (1.397 m), weight 54.432 kg (120 lb), SpO2 93 %.  Intake/Output Summary (Last 24 hours) at 10/25/15 0856 Last data filed at 10/24/15 2115  Gross per 24 hour  Intake    600 ml  Output    400 ml  Net    200 ml   Filed Weights   10/22/15 0500  Weight: 54.432 kg (120 lb)    Exam Awake Alert-Pleasantly confused,  No new F.N deficits, Normal affect [place].AT,PERRAL Supple Neck,No JVD, No cervical lymphadenopathy appriciated.  Symmetrical Chest wall  movement, Good air movement bilaterally, CTAB RRR,No Gallops,Rubs or new Murmurs, No Parasternal Heave +ve B.Sounds, Abd Soft, Non tender, No organomegaly appriciated, No rebound -guarding or rigidity. No Cyanosis, Clubbing or edema, No new Rash or bruise  DISCHARGE CONDITION: Stable  DISPOSITION: SNF  DISCHARGE INSTRUCTIONS:    Activity:  50% weightbearing to left lower extremity with walker  Get Medicines reviewed and adjusted: Please take all your medications with you for your next visit with your Primary MD  Please request your Primary MD to go over all hospital tests and procedure/radiological results at the follow up, please ask your Primary MD to get all Hospital records sent to his/her office.  If you experience worsening of your admission symptoms, develop shortness of breath, life threatening emergency, suicidal or homicidal thoughts you must seek medical attention immediately by calling 911 or calling your MD immediately  if symptoms less severe.  You must read complete instructions/literature along with all the possible adverse reactions/side effects for all the Medicines you take and that have been prescribed to you. Take any new Medicines after you have completely understood and accpet all the possible adverse reactions/side effects.   Do not drive when taking Pain medications.   Do not take more than prescribed Pain, Sleep and Anxiety Medications  Special Instructions: If you have smoked or chewed Tobacco  in the last 2 yrs please stop smoking, stop any regular Alcohol  and or any Recreational drug use.  Wear Seat belts while driving.  Please note  You were cared for by a hospitalist during your hospital stay. Once you are discharged, your primary care physician will handle any further medical issues. Please note that NO REFILLS for any discharge medications will be authorized once you are discharged, as it is imperative that you return to your primary care physician  (or establish a relationship with a primary care physician if you do not have one) for your aftercare needs so that they can reassess your need for medications and monitor your lab values.   Diet recommendation: Heart Healthy diet  Discharge Instructions    Call MD for:  persistant nausea and vomiting    Complete by:  As directed      Call MD for:  redness, tenderness, or signs of infection (pain, swelling, redness, odor or green/yellow discharge around incision site)    Complete by:  As directed  Call MD for:  severe uncontrolled pain    Complete by:  As directed      Diet - low sodium heart healthy    Complete by:  As directed      Increase activity slowly    Complete by:  As directed      Partial weight bearing    Complete by:  As directed   % Body Weight:  50  Laterality:  left  Extremity:  Lower           Follow-up Information    Follow up with Swinteck, Cloyde Reams, MD. Schedule an appointment as soon as possible for a visit in 2 weeks.   Specialty:  Orthopedic Surgery   Why:  For wound re-check   Contact information:   [address] [place] [address] [phone]       Follow up with [name], MD. Schedule an appointment as soon as possible for a visit in 2 weeks.   Specialty:  Internal Medicine   Why:  Hospital follow up   Contact information:   [address] [place] [address] [phone]      Total Time spent on discharge equals  45 minutes.  SignedJeoffrey Massed 10/25/2015 8:56 AM

## 2015-10-25 NOTE — Care Management Important Message (Signed)
Important Message  Patient Details  Name: Edman CircleCharlotte A Cedotal MRN: [id number] Date of Birth: [birth year]   Medicare Important Message Given:  Yes    Haskell FlirtJamison, Mahayla Haddaway 10/25/2015, 10:28 AMImportant Message  Patient Details  Name: Edman CircleCharlotte A Meyerhoff MRN: [id number] Date of Birth: [birth year]   Medicare Important Message Given:  Yes    Haskell FlirtJamison, Telisa Ohlsen 10/25/2015, 10:28 AM

## 2015-10-25 NOTE — Progress Notes (Signed)
Date:  June12, 2017 Chart reviewed for concurrent status and case management needs. Will continue to follow the patient for changes and needs:  snf placement no dc needs Expected discharge date: [id number] Marcelle SmilingRhonda Shaketha Jeon, BSN, [place]3, [place]   [phone]

## 2015-10-26 DIAGNOSIS — S72009A Fracture of unspecified part of neck of unspecified femur, initial encounter for closed fracture: Secondary | ICD-10-CM | POA: Diagnosis not present

## 2015-10-26 DIAGNOSIS — I10 Essential (primary) hypertension: Secondary | ICD-10-CM | POA: Diagnosis not present

## 2015-10-26 DIAGNOSIS — E039 Hypothyroidism, unspecified: Secondary | ICD-10-CM | POA: Diagnosis not present

## 2015-10-26 DIAGNOSIS — F0390 Unspecified dementia without behavioral disturbance: Secondary | ICD-10-CM | POA: Diagnosis not present

## 2015-10-26 DIAGNOSIS — R911 Solitary pulmonary nodule: Secondary | ICD-10-CM | POA: Diagnosis not present

## 2015-11-06 DIAGNOSIS — E559 Vitamin D deficiency, unspecified: Secondary | ICD-10-CM | POA: Diagnosis not present

## 2015-11-06 DIAGNOSIS — R400 Somnolence: Secondary | ICD-10-CM | POA: Diagnosis not present

## 2015-11-06 DIAGNOSIS — R451 Restlessness and agitation: Secondary | ICD-10-CM | POA: Diagnosis not present

## 2015-11-08 DIAGNOSIS — S72142D Displaced intertrochanteric fracture of left femur, subsequent encounter for closed fracture with routine healing: Secondary | ICD-10-CM | POA: Diagnosis not present

## 2015-11-09 ENCOUNTER — Other Ambulatory Visit: Payer: Self-pay | Admitting: Internal Medicine

## 2015-11-18 DIAGNOSIS — R451 Restlessness and agitation: Secondary | ICD-10-CM | POA: Diagnosis not present

## 2015-12-01 ENCOUNTER — Other Ambulatory Visit: Payer: Self-pay | Admitting: Internal Medicine

## 2015-12-01 NOTE — Telephone Encounter (Signed)
Verbal order by Dr. Lenord FellersBaxley to refill Ramipril 10mg , #90, 3 refills.  Spoke with Ugi @ [facility] [phone].

## 2015-12-05 DIAGNOSIS — S72002A Fracture of unspecified part of neck of left femur, initial encounter for closed fracture: Secondary | ICD-10-CM | POA: Diagnosis not present

## 2015-12-05 DIAGNOSIS — F0391 Unspecified dementia with behavioral disturbance: Secondary | ICD-10-CM | POA: Diagnosis not present

## 2015-12-05 DIAGNOSIS — F0390 Unspecified dementia without behavioral disturbance: Secondary | ICD-10-CM | POA: Diagnosis not present

## 2015-12-05 DIAGNOSIS — G3183 Dementia with Lewy bodies: Secondary | ICD-10-CM | POA: Diagnosis not present

## 2015-12-05 DIAGNOSIS — I10 Essential (primary) hypertension: Secondary | ICD-10-CM | POA: Diagnosis not present

## 2015-12-05 DIAGNOSIS — R911 Solitary pulmonary nodule: Secondary | ICD-10-CM | POA: Diagnosis not present

## 2015-12-05 DIAGNOSIS — S72002D Fracture of unspecified part of neck of left femur, subsequent encounter for closed fracture with routine healing: Secondary | ICD-10-CM | POA: Diagnosis not present

## 2015-12-05 DIAGNOSIS — E039 Hypothyroidism, unspecified: Secondary | ICD-10-CM | POA: Diagnosis not present

## 2015-12-06 ENCOUNTER — Telehealth: Payer: Self-pay | Admitting: Internal Medicine

## 2015-12-06 DIAGNOSIS — S72002D Fracture of unspecified part of neck of left femur, subsequent encounter for closed fracture with routine healing: Secondary | ICD-10-CM | POA: Diagnosis not present

## 2015-12-06 DIAGNOSIS — G3183 Dementia with Lewy bodies: Secondary | ICD-10-CM | POA: Diagnosis not present

## 2015-12-06 NOTE — Telephone Encounter (Signed)
Arnold @ [facility] calls stating PT did evaluation yesterday and would like to get a verbal order for frequency of care.  He will fax the order for sign off.

## 2015-12-07 ENCOUNTER — Telehealth: Payer: Self-pay | Admitting: Internal Medicine

## 2015-12-07 DIAGNOSIS — S728X2D Other fracture of left femur, subsequent encounter for closed fracture with routine healing: Secondary | ICD-10-CM | POA: Diagnosis not present

## 2015-12-08 DIAGNOSIS — G3183 Dementia with Lewy bodies: Secondary | ICD-10-CM | POA: Diagnosis not present

## 2015-12-08 DIAGNOSIS — S72002D Fracture of unspecified part of neck of left femur, subsequent encounter for closed fracture with routine healing: Secondary | ICD-10-CM | POA: Diagnosis not present

## 2015-12-08 NOTE — Telephone Encounter (Signed)
Opened this patient's account in error.  No note to be entered at this time.

## 2015-12-10 ENCOUNTER — Telehealth: Payer: Self-pay | Admitting: Internal Medicine

## 2015-12-10 DIAGNOSIS — S72002D Fracture of unspecified part of neck of left femur, subsequent encounter for closed fracture with routine healing: Secondary | ICD-10-CM | POA: Diagnosis not present

## 2015-12-10 DIAGNOSIS — G3183 Dementia with Lewy bodies: Secondary | ICD-10-CM | POA: Diagnosis not present

## 2015-12-10 NOTE — Telephone Encounter (Signed)
Noted.  Follow up next week

## 2015-12-10 NOTE — Telephone Encounter (Signed)
Daughter states that her Mom got up in the middle of the night and took a tumble last night.  She didn't indicate what she fell over.  She didn't hurt herself other than she cut 2 of her fingers.  They have bandaged up the fingers.  When she got up this morning, her BP was reading pretty low, 99/56, so she held her BP meds.  [facility] is seeing her.  When Roe Coombs (Occupational Therapist went out to see her today, her BP was 104/58.  He looked at her fingers and they look ok, wrapped up.  She isn't complaining of any pain per Roe Coombs.  Roe Coombs states that he has recommended bedrails for the bed and he has also instructed them where they could obtain these bedrails.    Patient has a follow up appointment with Dr. Lenord Fellers next week.    Roe Coombs is with [facility] [phone]).

## 2015-12-13 DIAGNOSIS — G3183 Dementia with Lewy bodies: Secondary | ICD-10-CM | POA: Diagnosis not present

## 2015-12-13 DIAGNOSIS — S72002D Fracture of unspecified part of neck of left femur, subsequent encounter for closed fracture with routine healing: Secondary | ICD-10-CM | POA: Diagnosis not present

## 2015-12-14 DIAGNOSIS — G3183 Dementia with Lewy bodies: Secondary | ICD-10-CM | POA: Diagnosis not present

## 2015-12-14 DIAGNOSIS — S72002D Fracture of unspecified part of neck of left femur, subsequent encounter for closed fracture with routine healing: Secondary | ICD-10-CM | POA: Diagnosis not present

## 2015-12-16 ENCOUNTER — Encounter: Payer: Self-pay | Admitting: Internal Medicine

## 2015-12-16 ENCOUNTER — Ambulatory Visit (INDEPENDENT_AMBULATORY_CARE_PROVIDER_SITE_OTHER): Payer: Medicare Other | Admitting: Internal Medicine

## 2015-12-16 VITALS — BP 132/70 | HR 73 | Temp 97.9°F | Ht <= 58 in | Wt 119.0 lb

## 2015-12-16 DIAGNOSIS — F0391 Unspecified dementia with behavioral disturbance: Secondary | ICD-10-CM

## 2015-12-16 DIAGNOSIS — S6992XA Unspecified injury of left wrist, hand and finger(s), initial encounter: Secondary | ICD-10-CM | POA: Diagnosis not present

## 2015-12-16 DIAGNOSIS — R413 Other amnesia: Secondary | ICD-10-CM | POA: Diagnosis not present

## 2015-12-16 DIAGNOSIS — Z8781 Personal history of (healed) traumatic fracture: Secondary | ICD-10-CM

## 2015-12-16 DIAGNOSIS — I10 Essential (primary) hypertension: Secondary | ICD-10-CM

## 2015-12-16 DIAGNOSIS — F0390 Unspecified dementia without behavioral disturbance: Secondary | ICD-10-CM | POA: Diagnosis not present

## 2015-12-16 DIAGNOSIS — E039 Hypothyroidism, unspecified: Secondary | ICD-10-CM | POA: Diagnosis not present

## 2015-12-16 LAB — CBC WITH DIFFERENTIAL/PLATELET
Basophils Absolute: 0 cells/uL (ref 0–200)
Basophils Relative: 0 %
EOS PCT: 1 %
Eosinophils Absolute: 75 cells/uL (ref 15–500)
HCT: 37.7 % (ref 35.0–45.0)
Hemoglobin: 12 g/dL (ref 11.7–15.5)
LYMPHS ABS: 1800 {cells}/uL (ref 850–3900)
LYMPHS PCT: 24 %
MCH: 29.6 pg (ref 27.0–33.0)
MCHC: 31.8 g/dL — AB (ref 32.0–36.0)
MCV: 93.1 fL (ref 80.0–100.0)
MPV: 10.8 fL (ref 7.5–12.5)
Monocytes Absolute: 600 cells/uL (ref 200–950)
Monocytes Relative: 8 %
NEUTROS PCT: 67 %
Neutro Abs: 5025 cells/uL (ref 1500–7800)
Platelets: 280 10*3/uL (ref 140–400)
RBC: 4.05 MIL/uL (ref 3.80–5.10)
RDW: 14.1 % (ref 11.0–15.0)
WBC: 7.5 10*3/uL (ref 3.8–10.8)

## 2015-12-16 MED ORDER — LORAZEPAM 1 MG PO TABS: 1.0000 mg | ORAL_TABLET | Freq: Two times a day (BID) | ORAL | 1 refills | Status: DC | PRN

## 2015-12-16 MED ORDER — DIVALPROEX SODIUM 250 MG PO DR TAB: 250.0000 mg | DELAYED_RELEASE_TABLET | Freq: Two times a day (BID) | ORAL | 2 refills | Status: DC

## 2015-12-16 NOTE — Progress Notes (Signed)
   Subjective:    Patient ID: Laura Lowe, female    DOB: July 04, 1919, 82 y.o.   MRN: [id number]  HPI 82 year old Female with dementia s/p fall with hip fracture for follow up. Was discharged from [facility] after short stay.Hip fracture occurred on June 8. Has intermittent episodes of agitation requiring acute dose of Ativan. Was placed on Depakota at Nursing Center for agitation which has helped. Today is calm and cooperative. Does not know me or my name and used to know those. Does not know date of birth, current year, day of week. Recently suffered lacerations left second and third fingers and was not taken to hospital for sutures. These lacerations could probably have used some sutures at the time. There is no evidence of secondary infection.  Her daughter who works for [employer] accompanies her today. Says she fell several times at the rehabilitation center and made virtually no progress. Was found in the floor at one point. I believe she was discharged from [facility] July 22nd.  She has a DO NOT RESUSCITATE order.  Family is requesting a letter regarding dementia diagnosis. She's unable to make decisions for her safety. Letter was written.    Review of Systems see above     Objective:   Physical Exam  Constitutional: She appears well-developed and well-nourished.  Neck: Neck supple. No JVD present.  Pulmonary/Chest: Effort normal and breath sounds normal. No respiratory distress. She has no wheezes. She has no rales.  Abdominal: She exhibits no distension and no mass. There is no tenderness. There is no rebound and no guarding.  Musculoskeletal: She exhibits no edema.  Neurological: Coordination normal.  Skin:  Lacerated left second and third fingers. No evidence of secondary infection          Assessment & Plan:  Dementia-Continue olanzapine Depakote and when necessary Ativan  Status post left hip fracture  Lacerations left second and  third fingers without evidence of secondary infection  Essential hypertension  Hearing loss  Hypothyroidism  History of iron deficiency  Glaucoma  Plan: Have refilled Depakote. Written prescription for Ativan 1 mg to use when necessary agitation. DO NOT RESUSCITATE completed. Letter regarding dementia diagnosis written. Labs reviewed. Return in 3 months or as needed. Hemoglobin is stable at 12 g. MCV is normal.

## 2015-12-17 ENCOUNTER — Other Ambulatory Visit: Payer: Self-pay | Admitting: Internal Medicine

## 2015-12-17 LAB — COMPLETE METABOLIC PANEL WITH GFR
ALBUMIN: 3.4 g/dL — AB (ref 3.6–5.1)
ALK PHOS: 89 U/L (ref 33–130)
ALT: 9 U/L (ref 6–29)
AST: 15 U/L (ref 10–35)
BILIRUBIN TOTAL: 0.4 mg/dL (ref 0.2–1.2)
BUN: 33 mg/dL — ABNORMAL HIGH (ref 7–25)
CALCIUM: 9 mg/dL (ref 8.6–10.4)
CO2: 25 mmol/L (ref 20–31)
Chloride: 107 mmol/L (ref 98–110)
Creat: 0.76 mg/dL (ref 0.60–0.88)
GFR, EST NON AFRICAN AMERICAN: 68 mL/min (ref >=60)
GFR, Est African American: 78 mL/min (ref >=60)
Glucose, Bld: 84 mg/dL (ref 65–99)
POTASSIUM: 4.8 mmol/L (ref 3.5–5.3)
SODIUM: 142 mmol/L (ref 135–146)
TOTAL PROTEIN: 6.2 g/dL (ref 6.1–8.1)

## 2015-12-17 NOTE — Telephone Encounter (Signed)
Called in #[phone]  Valium 5 mg to take bid prn with no refill

## 2015-12-20 DIAGNOSIS — W19XXXD Unspecified fall, subsequent encounter: Secondary | ICD-10-CM | POA: Diagnosis not present

## 2015-12-20 DIAGNOSIS — F0281 Dementia in other diseases classified elsewhere with behavioral disturbance: Secondary | ICD-10-CM | POA: Diagnosis not present

## 2015-12-20 DIAGNOSIS — G3183 Dementia with Lewy bodies: Secondary | ICD-10-CM | POA: Diagnosis not present

## 2015-12-20 DIAGNOSIS — G309 Alzheimer's disease, unspecified: Secondary | ICD-10-CM | POA: Diagnosis not present

## 2015-12-20 DIAGNOSIS — Z96652 Presence of left artificial knee joint: Secondary | ICD-10-CM | POA: Diagnosis not present

## 2015-12-20 DIAGNOSIS — S72002D Fracture of unspecified part of neck of left femur, subsequent encounter for closed fracture with routine healing: Secondary | ICD-10-CM | POA: Diagnosis not present

## 2015-12-20 DIAGNOSIS — H9190 Unspecified hearing loss, unspecified ear: Secondary | ICD-10-CM | POA: Diagnosis not present

## 2015-12-20 DIAGNOSIS — I10 Essential (primary) hypertension: Secondary | ICD-10-CM

## 2015-12-21 DIAGNOSIS — G3183 Dementia with Lewy bodies: Secondary | ICD-10-CM | POA: Diagnosis not present

## 2015-12-21 DIAGNOSIS — S72002D Fracture of unspecified part of neck of left femur, subsequent encounter for closed fracture with routine healing: Secondary | ICD-10-CM | POA: Diagnosis not present

## 2015-12-22 DIAGNOSIS — G3183 Dementia with Lewy bodies: Secondary | ICD-10-CM | POA: Diagnosis not present

## 2015-12-22 DIAGNOSIS — S72002D Fracture of unspecified part of neck of left femur, subsequent encounter for closed fracture with routine healing: Secondary | ICD-10-CM | POA: Diagnosis not present

## 2015-12-24 ENCOUNTER — Other Ambulatory Visit: Payer: Self-pay | Admitting: Internal Medicine

## 2015-12-27 ENCOUNTER — Other Ambulatory Visit: Payer: Self-pay

## 2015-12-27 MED ORDER — OLANZAPINE 10 MG PO TABS: 10.0000 mg | ORAL_TABLET | Freq: Every day | ORAL | 0 refills | Status: DC

## 2015-12-29 DIAGNOSIS — G3183 Dementia with Lewy bodies: Secondary | ICD-10-CM | POA: Diagnosis not present

## 2015-12-29 DIAGNOSIS — S72002D Fracture of unspecified part of neck of left femur, subsequent encounter for closed fracture with routine healing: Secondary | ICD-10-CM | POA: Diagnosis not present

## 2016-01-02 NOTE — Patient Instructions (Signed)
Continue same medications and return in 3 months. Give Ativan 1 mg up to 3 times a day when necessary agitation. Continue Depakote. Continue olanzapine

## 2016-01-05 ENCOUNTER — Ambulatory Visit (INDEPENDENT_AMBULATORY_CARE_PROVIDER_SITE_OTHER): Payer: Medicare Other | Admitting: Podiatry

## 2016-01-05 DIAGNOSIS — M79676 Pain in unspecified toe(s): Secondary | ICD-10-CM

## 2016-01-05 DIAGNOSIS — B351 Tinea unguium: Secondary | ICD-10-CM

## 2016-01-05 NOTE — Progress Notes (Addendum)
Patient ID: Laura Lowe, female   DOB: 10-13-1919, 82 y.o.   MRN: [id number] Complaint:  Visit Type: Patient returns to my office for continued preventative foot care services. Complaint: Patient states" my nails have grown long and thick and become painful to walk and wear shoes". The patient presents for preventative foot care services. No changes to ROS  Podiatric Exam: Vascular: dorsalis pedis and posterior tibial pulses are not  palpable bilateral. Capillary return is immediate. Temperature gradient is WNL. Skin turgor WNL  Sensorium: Normal Semmes Weinstein monofilament test. Normal tactile sensation bilaterally. Nail Exam: Pt has thick disfigured discolored nails with subungual debris noted bilateral entire nail hallux through fifth toenails Ulcer Exam: There is no evidence of ulcer or pre-ulcerative changes or infection. Orthopedic Exam: Muscle tone and strength are WNL. No limitations in general ROM. No crepitus or effusions noted. Foot type and digits show no abnormalities.  HAV with hammer toe second  B/l. Skin: No Porokeratosis. No infection or ulcers  Diagnosis:  Onychomycosis, , Pain in right toe, pain in left toes  Treatment & Plan Procedures and Treatment: Consent by patient was obtained for treatment procedures. The patient understood the discussion of treatment and procedures well. All questions were answered thoroughly reviewed. Debridement of mycotic and hypertrophic toenails, 1 through 5 bilateral and clearing of subungual debris. No ulceration, no infection noted.  Return Visit-Office Procedure: Patient instructed to return to the office for a follow up visit 3 months for continued evaluation and treatment.   Helane GuntherGregory Annaleigh Steinmeyer DPM

## 2016-01-18 DIAGNOSIS — S728X2A Other fracture of left femur, initial encounter for closed fracture: Secondary | ICD-10-CM | POA: Diagnosis not present

## 2016-01-26 ENCOUNTER — Other Ambulatory Visit: Payer: Self-pay | Admitting: Internal Medicine

## 2016-02-23 ENCOUNTER — Other Ambulatory Visit: Payer: Self-pay | Admitting: Internal Medicine

## 2016-02-23 NOTE — Telephone Encounter (Signed)
Verbal order by Dr. Baxley; ok to refill DiazLenord Lowe 5mg .  Take 1 tablet by mouth twice daily as needed for anxiety.  Disp #60, 0 refill.  Called [facility] @ [phone].  Left voicemail.

## 2016-03-16 ENCOUNTER — Encounter: Payer: Self-pay | Admitting: Internal Medicine

## 2016-03-16 ENCOUNTER — Ambulatory Visit (INDEPENDENT_AMBULATORY_CARE_PROVIDER_SITE_OTHER): Payer: Medicare Other | Admitting: Internal Medicine

## 2016-03-16 VITALS — BP 104/64 | HR 91 | Temp 97.3°F | Wt 109.0 lb

## 2016-03-16 DIAGNOSIS — R413 Other amnesia: Secondary | ICD-10-CM | POA: Diagnosis not present

## 2016-03-16 DIAGNOSIS — E039 Hypothyroidism, unspecified: Secondary | ICD-10-CM | POA: Diagnosis not present

## 2016-03-16 DIAGNOSIS — I10 Essential (primary) hypertension: Secondary | ICD-10-CM | POA: Diagnosis not present

## 2016-03-16 DIAGNOSIS — R634 Abnormal weight loss: Secondary | ICD-10-CM

## 2016-03-16 DIAGNOSIS — E8809 Other disorders of plasma-protein metabolism, not elsewhere classified: Secondary | ICD-10-CM

## 2016-03-16 DIAGNOSIS — Z23 Encounter for immunization: Secondary | ICD-10-CM

## 2016-03-16 LAB — CBC WITH DIFFERENTIAL/PLATELET
BASOS PCT: 0 %
Basophils Absolute: 0 cells/uL (ref 0–200)
EOS ABS: 200 {cells}/uL (ref 15–500)
EOS PCT: 4 %
HCT: 38.3 % (ref 35.0–45.0)
Hemoglobin: 12.1 g/dL (ref 11.7–15.5)
Lymphocytes Relative: 28 %
Lymphs Abs: 1400 cells/uL (ref 850–3900)
MCH: 30.6 pg (ref 27.0–33.0)
MCHC: 31.6 g/dL — ABNORMAL LOW (ref 32.0–36.0)
MCV: 97 fL (ref 80.0–100.0)
MONOS PCT: 8 %
MPV: 10.6 fL (ref 7.5–12.5)
Monocytes Absolute: 400 cells/uL (ref 200–950)
NEUTROS ABS: 3000 {cells}/uL (ref 1500–7800)
Neutrophils Relative %: 60 %
PLATELETS: 218 10*3/uL (ref 140–400)
RBC: 3.95 MIL/uL (ref 3.80–5.10)
RDW: 14.5 % (ref 11.0–15.0)
WBC: 5 10*3/uL (ref 3.8–10.8)

## 2016-03-16 LAB — COMPREHENSIVE METABOLIC PANEL
ALK PHOS: 97 U/L (ref 33–130)
ALT: 9 U/L (ref 6–29)
AST: 14 U/L (ref 10–35)
Albumin: 3.1 g/dL — ABNORMAL LOW (ref 3.6–5.1)
BILIRUBIN TOTAL: 0.5 mg/dL (ref 0.2–1.2)
BUN: 32 mg/dL — AB (ref 7–25)
CO2: 27 mmol/L (ref 20–31)
CREATININE: 0.73 mg/dL (ref 0.60–0.88)
Calcium: 8.6 mg/dL (ref 8.6–10.4)
Chloride: 110 mmol/L (ref 98–110)
GLUCOSE: 119 mg/dL — AB (ref 65–99)
Potassium: 4.1 mmol/L (ref 3.5–5.3)
SODIUM: 145 mmol/L (ref 135–146)
Total Protein: 5.7 g/dL — ABNORMAL LOW (ref 6.1–8.1)

## 2016-03-16 LAB — TSH: TSH: 1.01 m[IU]/L

## 2016-03-16 MED ORDER — LEVOTHYROXINE SODIUM 50 MCG PO TABS
50.0000 ug | ORAL_TABLET | Freq: Every day | ORAL | 0 refills | Status: AC
Start: 2016-03-16 — End: ?

## 2016-03-16 MED ORDER — PAROXETINE HCL 10 MG PO TABS: 10.0000 mg | ORAL_TABLET | Freq: Every day | ORAL | 1 refills | Status: DC

## 2016-03-16 MED ORDER — DIVALPROEX SODIUM 125 MG PO CSDR: 250.0000 mg | DELAYED_RELEASE_CAPSULE | Freq: Two times a day (BID) | ORAL | 0 refills | Status: DC

## 2016-03-16 MED ORDER — RAMIPRIL 10 MG PO CAPS: 10.0000 mg | ORAL_CAPSULE | Freq: Every day | ORAL | 3 refills | Status: AC

## 2016-03-16 MED ORDER — OLANZAPINE 10 MG PO TABS: 10.0000 mg | ORAL_TABLET | Freq: Every day | ORAL | 1 refills | Status: AC

## 2016-03-16 MED ORDER — LORAZEPAM 1 MG PO TABS: 1.0000 mg | ORAL_TABLET | Freq: Two times a day (BID) | ORAL | 1 refills | Status: AC | PRN

## 2016-03-16 MED ORDER — MEMANTINE HCL 10 MG PO TABS: 10.0000 mg | ORAL_TABLET | Freq: Two times a day (BID) | ORAL | 1 refills | Status: AC

## 2016-03-16 MED ORDER — DIAZEPAM 5 MG PO TABS: 5.0000 mg | ORAL_TABLET | Freq: Two times a day (BID) | ORAL | 0 refills | Status: AC | PRN

## 2016-03-24 NOTE — Progress Notes (Signed)
   Subjective:    Patient ID: Laura Lowe, female    DOB: Nov 30, 1919, 82 y.o.   MRN: [id number]  HPI 82 year old Female in today to follow-up on dementia and other medical issues. Accompanied by her daughter today who says the patient is not eating very much. Seems to have little appetite. Says very little. Intermittently is agitated and has to be given an acute dose of Ativan. She had a hip fracture in June 2017. At that time [facility] and was placed on Depakote which helped agitation. Today she is calm and cooperative. Does not know me or my name. She used to know me. She is now residing with one of her daughters, Laura Lowe who is a Engineer, civil (consulting)nurse.  She has a history of essential hypertension, hypothyroidism, iron deficiency anemia, hearing loss and glaucoma.  She has a DO NOT RESUSCITATE order.  Daughter is requesting hospice consultation.  Daughter says patient has gone downhill since last office visit in terms of appetite and speaking.    Review of Systems as above     Objective:   Physical Exam Neck is supple without JVD thyromegaly or carotid bruits. Chest clear. Cardiac exam regular rate and rhythm. Extremities without edema. Skin is warm and dry. She is cooperative and not agitated.       Assessment & Plan:  End-stage dementia-managed with medication. Weight in August was 119 pounds. Current weight 109 pounds.  Essential hypertension-blood pressure stable at 104/64  Hypothyroidism-TSH is normal on thyroid replacement  Total protein 5.7 and albumin 3.1 consistent with decreased by mouth intake  History of iron deficiency anemia but hemoglobin is normal at 12.1 g  Plan: Follow-up in 3-4 months. Make hospice referral. Medications refilled at daughter's request

## 2016-03-24 NOTE — Patient Instructions (Addendum)
Hospice referral will be made. Continue same medications and follow-up 3-4 months

## 2016-03-29 DIAGNOSIS — R269 Unspecified abnormalities of gait and mobility: Secondary | ICD-10-CM | POA: Diagnosis not present

## 2016-03-29 DIAGNOSIS — R634 Abnormal weight loss: Secondary | ICD-10-CM | POA: Diagnosis not present

## 2016-03-29 DIAGNOSIS — I10 Essential (primary) hypertension: Secondary | ICD-10-CM | POA: Diagnosis not present

## 2016-03-29 DIAGNOSIS — F0280 Dementia in other diseases classified elsewhere without behavioral disturbance: Secondary | ICD-10-CM | POA: Diagnosis not present

## 2016-03-29 DIAGNOSIS — G309 Alzheimer's disease, unspecified: Secondary | ICD-10-CM | POA: Diagnosis not present

## 2016-03-29 DIAGNOSIS — R296 Repeated falls: Secondary | ICD-10-CM | POA: Diagnosis not present

## 2016-03-30 DIAGNOSIS — I10 Essential (primary) hypertension: Secondary | ICD-10-CM | POA: Diagnosis not present

## 2016-03-30 DIAGNOSIS — R634 Abnormal weight loss: Secondary | ICD-10-CM | POA: Diagnosis not present

## 2016-03-30 DIAGNOSIS — R296 Repeated falls: Secondary | ICD-10-CM | POA: Diagnosis not present

## 2016-03-30 DIAGNOSIS — F0280 Dementia in other diseases classified elsewhere without behavioral disturbance: Secondary | ICD-10-CM | POA: Diagnosis not present

## 2016-03-30 DIAGNOSIS — G309 Alzheimer's disease, unspecified: Secondary | ICD-10-CM | POA: Diagnosis not present

## 2016-03-30 DIAGNOSIS — R269 Unspecified abnormalities of gait and mobility: Secondary | ICD-10-CM | POA: Diagnosis not present

## 2016-03-31 DIAGNOSIS — G309 Alzheimer's disease, unspecified: Secondary | ICD-10-CM | POA: Diagnosis not present

## 2016-03-31 DIAGNOSIS — I10 Essential (primary) hypertension: Secondary | ICD-10-CM | POA: Diagnosis not present

## 2016-03-31 DIAGNOSIS — R296 Repeated falls: Secondary | ICD-10-CM | POA: Diagnosis not present

## 2016-03-31 DIAGNOSIS — R269 Unspecified abnormalities of gait and mobility: Secondary | ICD-10-CM | POA: Diagnosis not present

## 2016-03-31 DIAGNOSIS — F0280 Dementia in other diseases classified elsewhere without behavioral disturbance: Secondary | ICD-10-CM | POA: Diagnosis not present

## 2016-03-31 DIAGNOSIS — R634 Abnormal weight loss: Secondary | ICD-10-CM | POA: Diagnosis not present

## 2016-04-01 DIAGNOSIS — R634 Abnormal weight loss: Secondary | ICD-10-CM | POA: Diagnosis not present

## 2016-04-01 DIAGNOSIS — G309 Alzheimer's disease, unspecified: Secondary | ICD-10-CM | POA: Diagnosis not present

## 2016-04-01 DIAGNOSIS — R269 Unspecified abnormalities of gait and mobility: Secondary | ICD-10-CM | POA: Diagnosis not present

## 2016-04-01 DIAGNOSIS — R296 Repeated falls: Secondary | ICD-10-CM | POA: Diagnosis not present

## 2016-04-01 DIAGNOSIS — F0280 Dementia in other diseases classified elsewhere without behavioral disturbance: Secondary | ICD-10-CM | POA: Diagnosis not present

## 2016-04-01 DIAGNOSIS — I10 Essential (primary) hypertension: Secondary | ICD-10-CM | POA: Diagnosis not present

## 2016-04-03 DIAGNOSIS — F0280 Dementia in other diseases classified elsewhere without behavioral disturbance: Secondary | ICD-10-CM | POA: Diagnosis not present

## 2016-04-03 DIAGNOSIS — R269 Unspecified abnormalities of gait and mobility: Secondary | ICD-10-CM | POA: Diagnosis not present

## 2016-04-03 DIAGNOSIS — R296 Repeated falls: Secondary | ICD-10-CM | POA: Diagnosis not present

## 2016-04-03 DIAGNOSIS — I10 Essential (primary) hypertension: Secondary | ICD-10-CM | POA: Diagnosis not present

## 2016-04-03 DIAGNOSIS — R634 Abnormal weight loss: Secondary | ICD-10-CM | POA: Diagnosis not present

## 2016-04-03 DIAGNOSIS — G309 Alzheimer's disease, unspecified: Secondary | ICD-10-CM | POA: Diagnosis not present

## 2016-04-05 ENCOUNTER — Ambulatory Visit: Payer: Medicare Other | Admitting: Podiatry

## 2016-04-05 DIAGNOSIS — R269 Unspecified abnormalities of gait and mobility: Secondary | ICD-10-CM | POA: Diagnosis not present

## 2016-04-05 DIAGNOSIS — F0280 Dementia in other diseases classified elsewhere without behavioral disturbance: Secondary | ICD-10-CM | POA: Diagnosis not present

## 2016-04-05 DIAGNOSIS — R296 Repeated falls: Secondary | ICD-10-CM | POA: Diagnosis not present

## 2016-04-05 DIAGNOSIS — G309 Alzheimer's disease, unspecified: Secondary | ICD-10-CM | POA: Diagnosis not present

## 2016-04-05 DIAGNOSIS — R634 Abnormal weight loss: Secondary | ICD-10-CM | POA: Diagnosis not present

## 2016-04-05 DIAGNOSIS — I10 Essential (primary) hypertension: Secondary | ICD-10-CM | POA: Diagnosis not present

## 2016-04-08 DIAGNOSIS — G309 Alzheimer's disease, unspecified: Secondary | ICD-10-CM | POA: Diagnosis not present

## 2016-04-08 DIAGNOSIS — F0280 Dementia in other diseases classified elsewhere without behavioral disturbance: Secondary | ICD-10-CM | POA: Diagnosis not present

## 2016-04-08 DIAGNOSIS — R634 Abnormal weight loss: Secondary | ICD-10-CM | POA: Diagnosis not present

## 2016-04-08 DIAGNOSIS — R269 Unspecified abnormalities of gait and mobility: Secondary | ICD-10-CM | POA: Diagnosis not present

## 2016-04-08 DIAGNOSIS — R296 Repeated falls: Secondary | ICD-10-CM | POA: Diagnosis not present

## 2016-04-08 DIAGNOSIS — I10 Essential (primary) hypertension: Secondary | ICD-10-CM | POA: Diagnosis not present

## 2016-04-12 DIAGNOSIS — G309 Alzheimer's disease, unspecified: Secondary | ICD-10-CM | POA: Diagnosis not present

## 2016-04-12 DIAGNOSIS — R296 Repeated falls: Secondary | ICD-10-CM | POA: Diagnosis not present

## 2016-04-12 DIAGNOSIS — R634 Abnormal weight loss: Secondary | ICD-10-CM | POA: Diagnosis not present

## 2016-04-12 DIAGNOSIS — F0280 Dementia in other diseases classified elsewhere without behavioral disturbance: Secondary | ICD-10-CM | POA: Diagnosis not present

## 2016-04-12 DIAGNOSIS — I10 Essential (primary) hypertension: Secondary | ICD-10-CM | POA: Diagnosis not present

## 2016-04-12 DIAGNOSIS — R269 Unspecified abnormalities of gait and mobility: Secondary | ICD-10-CM | POA: Diagnosis not present

## 2016-04-13 DIAGNOSIS — R296 Repeated falls: Secondary | ICD-10-CM | POA: Diagnosis not present

## 2016-04-13 DIAGNOSIS — I10 Essential (primary) hypertension: Secondary | ICD-10-CM | POA: Diagnosis not present

## 2016-04-13 DIAGNOSIS — R634 Abnormal weight loss: Secondary | ICD-10-CM | POA: Diagnosis not present

## 2016-04-13 DIAGNOSIS — F0280 Dementia in other diseases classified elsewhere without behavioral disturbance: Secondary | ICD-10-CM | POA: Diagnosis not present

## 2016-04-13 DIAGNOSIS — R269 Unspecified abnormalities of gait and mobility: Secondary | ICD-10-CM | POA: Diagnosis not present

## 2016-04-13 DIAGNOSIS — G309 Alzheimer's disease, unspecified: Secondary | ICD-10-CM | POA: Diagnosis not present

## 2016-04-14 DIAGNOSIS — R634 Abnormal weight loss: Secondary | ICD-10-CM | POA: Diagnosis not present

## 2016-04-14 DIAGNOSIS — F0280 Dementia in other diseases classified elsewhere without behavioral disturbance: Secondary | ICD-10-CM | POA: Diagnosis not present

## 2016-04-14 DIAGNOSIS — R296 Repeated falls: Secondary | ICD-10-CM | POA: Diagnosis not present

## 2016-04-14 DIAGNOSIS — I10 Essential (primary) hypertension: Secondary | ICD-10-CM | POA: Diagnosis not present

## 2016-04-14 DIAGNOSIS — G309 Alzheimer's disease, unspecified: Secondary | ICD-10-CM | POA: Diagnosis not present

## 2016-04-14 DIAGNOSIS — R269 Unspecified abnormalities of gait and mobility: Secondary | ICD-10-CM | POA: Diagnosis not present

## 2016-04-15 DIAGNOSIS — G309 Alzheimer's disease, unspecified: Secondary | ICD-10-CM | POA: Diagnosis not present

## 2016-04-15 DIAGNOSIS — R634 Abnormal weight loss: Secondary | ICD-10-CM | POA: Diagnosis not present

## 2016-04-15 DIAGNOSIS — I10 Essential (primary) hypertension: Secondary | ICD-10-CM | POA: Diagnosis not present

## 2016-04-15 DIAGNOSIS — F0280 Dementia in other diseases classified elsewhere without behavioral disturbance: Secondary | ICD-10-CM | POA: Diagnosis not present

## 2016-04-15 DIAGNOSIS — R269 Unspecified abnormalities of gait and mobility: Secondary | ICD-10-CM | POA: Diagnosis not present

## 2016-04-15 DIAGNOSIS — R296 Repeated falls: Secondary | ICD-10-CM | POA: Diagnosis not present

## 2016-04-17 DIAGNOSIS — R634 Abnormal weight loss: Secondary | ICD-10-CM | POA: Diagnosis not present

## 2016-04-17 DIAGNOSIS — R269 Unspecified abnormalities of gait and mobility: Secondary | ICD-10-CM | POA: Diagnosis not present

## 2016-04-17 DIAGNOSIS — R296 Repeated falls: Secondary | ICD-10-CM | POA: Diagnosis not present

## 2016-04-17 DIAGNOSIS — G309 Alzheimer's disease, unspecified: Secondary | ICD-10-CM | POA: Diagnosis not present

## 2016-04-17 DIAGNOSIS — I10 Essential (primary) hypertension: Secondary | ICD-10-CM | POA: Diagnosis not present

## 2016-04-17 DIAGNOSIS — F0280 Dementia in other diseases classified elsewhere without behavioral disturbance: Secondary | ICD-10-CM | POA: Diagnosis not present

## 2016-04-19 DIAGNOSIS — R634 Abnormal weight loss: Secondary | ICD-10-CM | POA: Diagnosis not present

## 2016-04-19 DIAGNOSIS — I10 Essential (primary) hypertension: Secondary | ICD-10-CM | POA: Diagnosis not present

## 2016-04-19 DIAGNOSIS — F0280 Dementia in other diseases classified elsewhere without behavioral disturbance: Secondary | ICD-10-CM | POA: Diagnosis not present

## 2016-04-19 DIAGNOSIS — R269 Unspecified abnormalities of gait and mobility: Secondary | ICD-10-CM | POA: Diagnosis not present

## 2016-04-19 DIAGNOSIS — R296 Repeated falls: Secondary | ICD-10-CM | POA: Diagnosis not present

## 2016-04-19 DIAGNOSIS — G309 Alzheimer's disease, unspecified: Secondary | ICD-10-CM | POA: Diagnosis not present

## 2016-04-20 DIAGNOSIS — R269 Unspecified abnormalities of gait and mobility: Secondary | ICD-10-CM | POA: Diagnosis not present

## 2016-04-20 DIAGNOSIS — R296 Repeated falls: Secondary | ICD-10-CM | POA: Diagnosis not present

## 2016-04-20 DIAGNOSIS — R634 Abnormal weight loss: Secondary | ICD-10-CM | POA: Diagnosis not present

## 2016-04-20 DIAGNOSIS — F0280 Dementia in other diseases classified elsewhere without behavioral disturbance: Secondary | ICD-10-CM | POA: Diagnosis not present

## 2016-04-20 DIAGNOSIS — I10 Essential (primary) hypertension: Secondary | ICD-10-CM | POA: Diagnosis not present

## 2016-04-20 DIAGNOSIS — G309 Alzheimer's disease, unspecified: Secondary | ICD-10-CM | POA: Diagnosis not present

## 2016-04-26 DIAGNOSIS — R634 Abnormal weight loss: Secondary | ICD-10-CM | POA: Diagnosis not present

## 2016-04-26 DIAGNOSIS — F0280 Dementia in other diseases classified elsewhere without behavioral disturbance: Secondary | ICD-10-CM | POA: Diagnosis not present

## 2016-04-26 DIAGNOSIS — G309 Alzheimer's disease, unspecified: Secondary | ICD-10-CM | POA: Diagnosis not present

## 2016-04-26 DIAGNOSIS — R269 Unspecified abnormalities of gait and mobility: Secondary | ICD-10-CM | POA: Diagnosis not present

## 2016-04-26 DIAGNOSIS — I10 Essential (primary) hypertension: Secondary | ICD-10-CM | POA: Diagnosis not present

## 2016-04-26 DIAGNOSIS — R296 Repeated falls: Secondary | ICD-10-CM | POA: Diagnosis not present

## 2016-04-27 DIAGNOSIS — G309 Alzheimer's disease, unspecified: Secondary | ICD-10-CM | POA: Diagnosis not present

## 2016-04-27 DIAGNOSIS — I10 Essential (primary) hypertension: Secondary | ICD-10-CM | POA: Diagnosis not present

## 2016-04-27 DIAGNOSIS — R296 Repeated falls: Secondary | ICD-10-CM | POA: Diagnosis not present

## 2016-04-27 DIAGNOSIS — R634 Abnormal weight loss: Secondary | ICD-10-CM | POA: Diagnosis not present

## 2016-04-27 DIAGNOSIS — F0280 Dementia in other diseases classified elsewhere without behavioral disturbance: Secondary | ICD-10-CM | POA: Diagnosis not present

## 2016-04-27 DIAGNOSIS — R269 Unspecified abnormalities of gait and mobility: Secondary | ICD-10-CM | POA: Diagnosis not present

## 2016-04-29 DIAGNOSIS — G309 Alzheimer's disease, unspecified: Secondary | ICD-10-CM | POA: Diagnosis not present

## 2016-04-29 DIAGNOSIS — F0280 Dementia in other diseases classified elsewhere without behavioral disturbance: Secondary | ICD-10-CM | POA: Diagnosis not present

## 2016-04-29 DIAGNOSIS — R634 Abnormal weight loss: Secondary | ICD-10-CM | POA: Diagnosis not present

## 2016-04-29 DIAGNOSIS — R269 Unspecified abnormalities of gait and mobility: Secondary | ICD-10-CM | POA: Diagnosis not present

## 2016-04-29 DIAGNOSIS — I10 Essential (primary) hypertension: Secondary | ICD-10-CM | POA: Diagnosis not present

## 2016-04-29 DIAGNOSIS — R296 Repeated falls: Secondary | ICD-10-CM | POA: Diagnosis not present

## 2016-05-03 DIAGNOSIS — I10 Essential (primary) hypertension: Secondary | ICD-10-CM | POA: Diagnosis not present

## 2016-05-03 DIAGNOSIS — G309 Alzheimer's disease, unspecified: Secondary | ICD-10-CM | POA: Diagnosis not present

## 2016-05-03 DIAGNOSIS — F0280 Dementia in other diseases classified elsewhere without behavioral disturbance: Secondary | ICD-10-CM | POA: Diagnosis not present

## 2016-05-03 DIAGNOSIS — R634 Abnormal weight loss: Secondary | ICD-10-CM | POA: Diagnosis not present

## 2016-05-03 DIAGNOSIS — R269 Unspecified abnormalities of gait and mobility: Secondary | ICD-10-CM | POA: Diagnosis not present

## 2016-05-03 DIAGNOSIS — R296 Repeated falls: Secondary | ICD-10-CM | POA: Diagnosis not present

## 2016-05-04 DIAGNOSIS — G309 Alzheimer's disease, unspecified: Secondary | ICD-10-CM | POA: Diagnosis not present

## 2016-05-04 DIAGNOSIS — I10 Essential (primary) hypertension: Secondary | ICD-10-CM | POA: Diagnosis not present

## 2016-05-04 DIAGNOSIS — R296 Repeated falls: Secondary | ICD-10-CM | POA: Diagnosis not present

## 2016-05-04 DIAGNOSIS — F0280 Dementia in other diseases classified elsewhere without behavioral disturbance: Secondary | ICD-10-CM | POA: Diagnosis not present

## 2016-05-04 DIAGNOSIS — R269 Unspecified abnormalities of gait and mobility: Secondary | ICD-10-CM | POA: Diagnosis not present

## 2016-05-04 DIAGNOSIS — R634 Abnormal weight loss: Secondary | ICD-10-CM | POA: Diagnosis not present

## 2016-05-06 DIAGNOSIS — R634 Abnormal weight loss: Secondary | ICD-10-CM | POA: Diagnosis not present

## 2016-05-06 DIAGNOSIS — F0280 Dementia in other diseases classified elsewhere without behavioral disturbance: Secondary | ICD-10-CM | POA: Diagnosis not present

## 2016-05-06 DIAGNOSIS — G309 Alzheimer's disease, unspecified: Secondary | ICD-10-CM | POA: Diagnosis not present

## 2016-05-06 DIAGNOSIS — I10 Essential (primary) hypertension: Secondary | ICD-10-CM | POA: Diagnosis not present

## 2016-05-06 DIAGNOSIS — R269 Unspecified abnormalities of gait and mobility: Secondary | ICD-10-CM | POA: Diagnosis not present

## 2016-05-06 DIAGNOSIS — R296 Repeated falls: Secondary | ICD-10-CM | POA: Diagnosis not present

## 2016-05-10 DIAGNOSIS — I10 Essential (primary) hypertension: Secondary | ICD-10-CM | POA: Diagnosis not present

## 2016-05-10 DIAGNOSIS — R269 Unspecified abnormalities of gait and mobility: Secondary | ICD-10-CM | POA: Diagnosis not present

## 2016-05-10 DIAGNOSIS — F0280 Dementia in other diseases classified elsewhere without behavioral disturbance: Secondary | ICD-10-CM | POA: Diagnosis not present

## 2016-05-10 DIAGNOSIS — R296 Repeated falls: Secondary | ICD-10-CM | POA: Diagnosis not present

## 2016-05-10 DIAGNOSIS — R634 Abnormal weight loss: Secondary | ICD-10-CM | POA: Diagnosis not present

## 2016-05-10 DIAGNOSIS — G309 Alzheimer's disease, unspecified: Secondary | ICD-10-CM | POA: Diagnosis not present

## 2016-05-11 DIAGNOSIS — G309 Alzheimer's disease, unspecified: Secondary | ICD-10-CM | POA: Diagnosis not present

## 2016-05-11 DIAGNOSIS — F0280 Dementia in other diseases classified elsewhere without behavioral disturbance: Secondary | ICD-10-CM | POA: Diagnosis not present

## 2016-05-11 DIAGNOSIS — R269 Unspecified abnormalities of gait and mobility: Secondary | ICD-10-CM | POA: Diagnosis not present

## 2016-05-11 DIAGNOSIS — I10 Essential (primary) hypertension: Secondary | ICD-10-CM | POA: Diagnosis not present

## 2016-05-11 DIAGNOSIS — R634 Abnormal weight loss: Secondary | ICD-10-CM | POA: Diagnosis not present

## 2016-05-11 DIAGNOSIS — R296 Repeated falls: Secondary | ICD-10-CM | POA: Diagnosis not present

## 2016-05-15 DIAGNOSIS — R269 Unspecified abnormalities of gait and mobility: Secondary | ICD-10-CM | POA: Diagnosis not present

## 2016-05-15 DIAGNOSIS — F0280 Dementia in other diseases classified elsewhere without behavioral disturbance: Secondary | ICD-10-CM | POA: Diagnosis not present

## 2016-05-15 DIAGNOSIS — R296 Repeated falls: Secondary | ICD-10-CM | POA: Diagnosis not present

## 2016-05-15 DIAGNOSIS — R634 Abnormal weight loss: Secondary | ICD-10-CM | POA: Diagnosis not present

## 2016-05-15 DIAGNOSIS — I10 Essential (primary) hypertension: Secondary | ICD-10-CM | POA: Diagnosis not present

## 2016-05-15 DIAGNOSIS — G309 Alzheimer's disease, unspecified: Secondary | ICD-10-CM | POA: Diagnosis not present

## 2016-05-17 DIAGNOSIS — R296 Repeated falls: Secondary | ICD-10-CM | POA: Diagnosis not present

## 2016-05-17 DIAGNOSIS — R269 Unspecified abnormalities of gait and mobility: Secondary | ICD-10-CM | POA: Diagnosis not present

## 2016-05-17 DIAGNOSIS — F0280 Dementia in other diseases classified elsewhere without behavioral disturbance: Secondary | ICD-10-CM | POA: Diagnosis not present

## 2016-05-17 DIAGNOSIS — I10 Essential (primary) hypertension: Secondary | ICD-10-CM | POA: Diagnosis not present

## 2016-05-17 DIAGNOSIS — R634 Abnormal weight loss: Secondary | ICD-10-CM | POA: Diagnosis not present

## 2016-05-17 DIAGNOSIS — G309 Alzheimer's disease, unspecified: Secondary | ICD-10-CM | POA: Diagnosis not present

## 2016-05-18 DIAGNOSIS — R269 Unspecified abnormalities of gait and mobility: Secondary | ICD-10-CM | POA: Diagnosis not present

## 2016-05-18 DIAGNOSIS — G309 Alzheimer's disease, unspecified: Secondary | ICD-10-CM | POA: Diagnosis not present

## 2016-05-18 DIAGNOSIS — R634 Abnormal weight loss: Secondary | ICD-10-CM | POA: Diagnosis not present

## 2016-05-18 DIAGNOSIS — I10 Essential (primary) hypertension: Secondary | ICD-10-CM | POA: Diagnosis not present

## 2016-05-18 DIAGNOSIS — F0280 Dementia in other diseases classified elsewhere without behavioral disturbance: Secondary | ICD-10-CM | POA: Diagnosis not present

## 2016-05-18 DIAGNOSIS — R296 Repeated falls: Secondary | ICD-10-CM | POA: Diagnosis not present

## 2016-05-20 DIAGNOSIS — R269 Unspecified abnormalities of gait and mobility: Secondary | ICD-10-CM | POA: Diagnosis not present

## 2016-05-20 DIAGNOSIS — R296 Repeated falls: Secondary | ICD-10-CM | POA: Diagnosis not present

## 2016-05-20 DIAGNOSIS — G309 Alzheimer's disease, unspecified: Secondary | ICD-10-CM | POA: Diagnosis not present

## 2016-05-20 DIAGNOSIS — R634 Abnormal weight loss: Secondary | ICD-10-CM | POA: Diagnosis not present

## 2016-05-20 DIAGNOSIS — I10 Essential (primary) hypertension: Secondary | ICD-10-CM | POA: Diagnosis not present

## 2016-05-20 DIAGNOSIS — F0280 Dementia in other diseases classified elsewhere without behavioral disturbance: Secondary | ICD-10-CM | POA: Diagnosis not present

## 2016-05-24 DIAGNOSIS — I10 Essential (primary) hypertension: Secondary | ICD-10-CM | POA: Diagnosis not present

## 2016-05-24 DIAGNOSIS — G309 Alzheimer's disease, unspecified: Secondary | ICD-10-CM | POA: Diagnosis not present

## 2016-05-24 DIAGNOSIS — R296 Repeated falls: Secondary | ICD-10-CM | POA: Diagnosis not present

## 2016-05-24 DIAGNOSIS — F0280 Dementia in other diseases classified elsewhere without behavioral disturbance: Secondary | ICD-10-CM | POA: Diagnosis not present

## 2016-05-24 DIAGNOSIS — R269 Unspecified abnormalities of gait and mobility: Secondary | ICD-10-CM | POA: Diagnosis not present

## 2016-05-24 DIAGNOSIS — R634 Abnormal weight loss: Secondary | ICD-10-CM | POA: Diagnosis not present

## 2016-05-25 DIAGNOSIS — R269 Unspecified abnormalities of gait and mobility: Secondary | ICD-10-CM | POA: Diagnosis not present

## 2016-05-25 DIAGNOSIS — R296 Repeated falls: Secondary | ICD-10-CM | POA: Diagnosis not present

## 2016-05-25 DIAGNOSIS — R634 Abnormal weight loss: Secondary | ICD-10-CM | POA: Diagnosis not present

## 2016-05-25 DIAGNOSIS — G309 Alzheimer's disease, unspecified: Secondary | ICD-10-CM | POA: Diagnosis not present

## 2016-05-25 DIAGNOSIS — I10 Essential (primary) hypertension: Secondary | ICD-10-CM | POA: Diagnosis not present

## 2016-05-25 DIAGNOSIS — F0280 Dementia in other diseases classified elsewhere without behavioral disturbance: Secondary | ICD-10-CM | POA: Diagnosis not present

## 2016-05-27 DIAGNOSIS — R634 Abnormal weight loss: Secondary | ICD-10-CM | POA: Diagnosis not present

## 2016-05-27 DIAGNOSIS — R296 Repeated falls: Secondary | ICD-10-CM | POA: Diagnosis not present

## 2016-05-27 DIAGNOSIS — G309 Alzheimer's disease, unspecified: Secondary | ICD-10-CM | POA: Diagnosis not present

## 2016-05-27 DIAGNOSIS — F0280 Dementia in other diseases classified elsewhere without behavioral disturbance: Secondary | ICD-10-CM | POA: Diagnosis not present

## 2016-05-27 DIAGNOSIS — I10 Essential (primary) hypertension: Secondary | ICD-10-CM | POA: Diagnosis not present

## 2016-05-27 DIAGNOSIS — R269 Unspecified abnormalities of gait and mobility: Secondary | ICD-10-CM | POA: Diagnosis not present

## 2016-05-30 DIAGNOSIS — R296 Repeated falls: Secondary | ICD-10-CM | POA: Diagnosis not present

## 2016-05-30 DIAGNOSIS — F0280 Dementia in other diseases classified elsewhere without behavioral disturbance: Secondary | ICD-10-CM | POA: Diagnosis not present

## 2016-05-30 DIAGNOSIS — R269 Unspecified abnormalities of gait and mobility: Secondary | ICD-10-CM | POA: Diagnosis not present

## 2016-05-30 DIAGNOSIS — I10 Essential (primary) hypertension: Secondary | ICD-10-CM | POA: Diagnosis not present

## 2016-05-30 DIAGNOSIS — G309 Alzheimer's disease, unspecified: Secondary | ICD-10-CM | POA: Diagnosis not present

## 2016-05-30 DIAGNOSIS — R634 Abnormal weight loss: Secondary | ICD-10-CM | POA: Diagnosis not present

## 2016-06-02 DIAGNOSIS — I10 Essential (primary) hypertension: Secondary | ICD-10-CM | POA: Diagnosis not present

## 2016-06-02 DIAGNOSIS — R269 Unspecified abnormalities of gait and mobility: Secondary | ICD-10-CM | POA: Diagnosis not present

## 2016-06-02 DIAGNOSIS — R634 Abnormal weight loss: Secondary | ICD-10-CM | POA: Diagnosis not present

## 2016-06-02 DIAGNOSIS — F0280 Dementia in other diseases classified elsewhere without behavioral disturbance: Secondary | ICD-10-CM | POA: Diagnosis not present

## 2016-06-02 DIAGNOSIS — G309 Alzheimer's disease, unspecified: Secondary | ICD-10-CM | POA: Diagnosis not present

## 2016-06-02 DIAGNOSIS — R296 Repeated falls: Secondary | ICD-10-CM | POA: Diagnosis not present

## 2016-06-03 DIAGNOSIS — I10 Essential (primary) hypertension: Secondary | ICD-10-CM | POA: Diagnosis not present

## 2016-06-03 DIAGNOSIS — R296 Repeated falls: Secondary | ICD-10-CM | POA: Diagnosis not present

## 2016-06-03 DIAGNOSIS — R634 Abnormal weight loss: Secondary | ICD-10-CM | POA: Diagnosis not present

## 2016-06-03 DIAGNOSIS — F0280 Dementia in other diseases classified elsewhere without behavioral disturbance: Secondary | ICD-10-CM | POA: Diagnosis not present

## 2016-06-03 DIAGNOSIS — R269 Unspecified abnormalities of gait and mobility: Secondary | ICD-10-CM | POA: Diagnosis not present

## 2016-06-03 DIAGNOSIS — G309 Alzheimer's disease, unspecified: Secondary | ICD-10-CM | POA: Diagnosis not present

## 2016-06-05 DIAGNOSIS — R269 Unspecified abnormalities of gait and mobility: Secondary | ICD-10-CM | POA: Diagnosis not present

## 2016-06-05 DIAGNOSIS — F0280 Dementia in other diseases classified elsewhere without behavioral disturbance: Secondary | ICD-10-CM | POA: Diagnosis not present

## 2016-06-05 DIAGNOSIS — I10 Essential (primary) hypertension: Secondary | ICD-10-CM | POA: Diagnosis not present

## 2016-06-05 DIAGNOSIS — G309 Alzheimer's disease, unspecified: Secondary | ICD-10-CM | POA: Diagnosis not present

## 2016-06-05 DIAGNOSIS — R634 Abnormal weight loss: Secondary | ICD-10-CM | POA: Diagnosis not present

## 2016-06-05 DIAGNOSIS — R296 Repeated falls: Secondary | ICD-10-CM | POA: Diagnosis not present

## 2016-06-07 DIAGNOSIS — F0280 Dementia in other diseases classified elsewhere without behavioral disturbance: Secondary | ICD-10-CM | POA: Diagnosis not present

## 2016-06-07 DIAGNOSIS — R634 Abnormal weight loss: Secondary | ICD-10-CM | POA: Diagnosis not present

## 2016-06-07 DIAGNOSIS — R296 Repeated falls: Secondary | ICD-10-CM | POA: Diagnosis not present

## 2016-06-07 DIAGNOSIS — I10 Essential (primary) hypertension: Secondary | ICD-10-CM | POA: Diagnosis not present

## 2016-06-07 DIAGNOSIS — R269 Unspecified abnormalities of gait and mobility: Secondary | ICD-10-CM | POA: Diagnosis not present

## 2016-06-07 DIAGNOSIS — G309 Alzheimer's disease, unspecified: Secondary | ICD-10-CM | POA: Diagnosis not present

## 2016-06-08 DIAGNOSIS — I10 Essential (primary) hypertension: Secondary | ICD-10-CM | POA: Diagnosis not present

## 2016-06-08 DIAGNOSIS — R634 Abnormal weight loss: Secondary | ICD-10-CM | POA: Diagnosis not present

## 2016-06-08 DIAGNOSIS — G309 Alzheimer's disease, unspecified: Secondary | ICD-10-CM | POA: Diagnosis not present

## 2016-06-08 DIAGNOSIS — R269 Unspecified abnormalities of gait and mobility: Secondary | ICD-10-CM | POA: Diagnosis not present

## 2016-06-08 DIAGNOSIS — F0280 Dementia in other diseases classified elsewhere without behavioral disturbance: Secondary | ICD-10-CM | POA: Diagnosis not present

## 2016-06-08 DIAGNOSIS — R296 Repeated falls: Secondary | ICD-10-CM | POA: Diagnosis not present

## 2016-06-10 DIAGNOSIS — R634 Abnormal weight loss: Secondary | ICD-10-CM | POA: Diagnosis not present

## 2016-06-10 DIAGNOSIS — R269 Unspecified abnormalities of gait and mobility: Secondary | ICD-10-CM | POA: Diagnosis not present

## 2016-06-10 DIAGNOSIS — F0280 Dementia in other diseases classified elsewhere without behavioral disturbance: Secondary | ICD-10-CM | POA: Diagnosis not present

## 2016-06-10 DIAGNOSIS — G309 Alzheimer's disease, unspecified: Secondary | ICD-10-CM | POA: Diagnosis not present

## 2016-06-10 DIAGNOSIS — R296 Repeated falls: Secondary | ICD-10-CM | POA: Diagnosis not present

## 2016-06-10 DIAGNOSIS — I10 Essential (primary) hypertension: Secondary | ICD-10-CM | POA: Diagnosis not present

## 2016-06-12 ENCOUNTER — Other Ambulatory Visit: Payer: Self-pay | Admitting: Internal Medicine

## 2016-06-13 DIAGNOSIS — I10 Essential (primary) hypertension: Secondary | ICD-10-CM | POA: Diagnosis not present

## 2016-06-13 DIAGNOSIS — R634 Abnormal weight loss: Secondary | ICD-10-CM | POA: Diagnosis not present

## 2016-06-13 DIAGNOSIS — R296 Repeated falls: Secondary | ICD-10-CM | POA: Diagnosis not present

## 2016-06-13 DIAGNOSIS — G309 Alzheimer's disease, unspecified: Secondary | ICD-10-CM | POA: Diagnosis not present

## 2016-06-13 DIAGNOSIS — R269 Unspecified abnormalities of gait and mobility: Secondary | ICD-10-CM | POA: Diagnosis not present

## 2016-06-13 DIAGNOSIS — F0280 Dementia in other diseases classified elsewhere without behavioral disturbance: Secondary | ICD-10-CM | POA: Diagnosis not present

## 2016-06-15 DIAGNOSIS — R634 Abnormal weight loss: Secondary | ICD-10-CM | POA: Diagnosis not present

## 2016-06-15 DIAGNOSIS — G309 Alzheimer's disease, unspecified: Secondary | ICD-10-CM | POA: Diagnosis not present

## 2016-06-15 DIAGNOSIS — R296 Repeated falls: Secondary | ICD-10-CM | POA: Diagnosis not present

## 2016-06-15 DIAGNOSIS — R269 Unspecified abnormalities of gait and mobility: Secondary | ICD-10-CM | POA: Diagnosis not present

## 2016-06-15 DIAGNOSIS — I10 Essential (primary) hypertension: Secondary | ICD-10-CM | POA: Diagnosis not present

## 2016-06-15 DIAGNOSIS — F0280 Dementia in other diseases classified elsewhere without behavioral disturbance: Secondary | ICD-10-CM | POA: Diagnosis not present

## 2016-06-16 DIAGNOSIS — R269 Unspecified abnormalities of gait and mobility: Secondary | ICD-10-CM | POA: Diagnosis not present

## 2016-06-16 DIAGNOSIS — R634 Abnormal weight loss: Secondary | ICD-10-CM | POA: Diagnosis not present

## 2016-06-16 DIAGNOSIS — R296 Repeated falls: Secondary | ICD-10-CM | POA: Diagnosis not present

## 2016-06-16 DIAGNOSIS — G309 Alzheimer's disease, unspecified: Secondary | ICD-10-CM | POA: Diagnosis not present

## 2016-06-16 DIAGNOSIS — F0280 Dementia in other diseases classified elsewhere without behavioral disturbance: Secondary | ICD-10-CM | POA: Diagnosis not present

## 2016-06-16 DIAGNOSIS — I10 Essential (primary) hypertension: Secondary | ICD-10-CM | POA: Diagnosis not present

## 2016-06-17 DIAGNOSIS — I10 Essential (primary) hypertension: Secondary | ICD-10-CM | POA: Diagnosis not present

## 2016-06-17 DIAGNOSIS — R296 Repeated falls: Secondary | ICD-10-CM | POA: Diagnosis not present

## 2016-06-17 DIAGNOSIS — F0280 Dementia in other diseases classified elsewhere without behavioral disturbance: Secondary | ICD-10-CM | POA: Diagnosis not present

## 2016-06-17 DIAGNOSIS — R269 Unspecified abnormalities of gait and mobility: Secondary | ICD-10-CM | POA: Diagnosis not present

## 2016-06-17 DIAGNOSIS — G309 Alzheimer's disease, unspecified: Secondary | ICD-10-CM | POA: Diagnosis not present

## 2016-06-17 DIAGNOSIS — R634 Abnormal weight loss: Secondary | ICD-10-CM | POA: Diagnosis not present

## 2016-06-21 DIAGNOSIS — R634 Abnormal weight loss: Secondary | ICD-10-CM | POA: Diagnosis not present

## 2016-06-21 DIAGNOSIS — I10 Essential (primary) hypertension: Secondary | ICD-10-CM | POA: Diagnosis not present

## 2016-06-21 DIAGNOSIS — F0280 Dementia in other diseases classified elsewhere without behavioral disturbance: Secondary | ICD-10-CM | POA: Diagnosis not present

## 2016-06-21 DIAGNOSIS — R269 Unspecified abnormalities of gait and mobility: Secondary | ICD-10-CM | POA: Diagnosis not present

## 2016-06-21 DIAGNOSIS — R296 Repeated falls: Secondary | ICD-10-CM | POA: Diagnosis not present

## 2016-06-21 DIAGNOSIS — G309 Alzheimer's disease, unspecified: Secondary | ICD-10-CM | POA: Diagnosis not present

## 2016-06-22 DIAGNOSIS — R269 Unspecified abnormalities of gait and mobility: Secondary | ICD-10-CM | POA: Diagnosis not present

## 2016-06-22 DIAGNOSIS — R296 Repeated falls: Secondary | ICD-10-CM | POA: Diagnosis not present

## 2016-06-22 DIAGNOSIS — I10 Essential (primary) hypertension: Secondary | ICD-10-CM | POA: Diagnosis not present

## 2016-06-22 DIAGNOSIS — R634 Abnormal weight loss: Secondary | ICD-10-CM | POA: Diagnosis not present

## 2016-06-22 DIAGNOSIS — F0280 Dementia in other diseases classified elsewhere without behavioral disturbance: Secondary | ICD-10-CM | POA: Diagnosis not present

## 2016-06-22 DIAGNOSIS — G309 Alzheimer's disease, unspecified: Secondary | ICD-10-CM | POA: Diagnosis not present

## 2016-06-23 DIAGNOSIS — R634 Abnormal weight loss: Secondary | ICD-10-CM | POA: Diagnosis not present

## 2016-06-23 DIAGNOSIS — R269 Unspecified abnormalities of gait and mobility: Secondary | ICD-10-CM | POA: Diagnosis not present

## 2016-06-23 DIAGNOSIS — F0280 Dementia in other diseases classified elsewhere without behavioral disturbance: Secondary | ICD-10-CM | POA: Diagnosis not present

## 2016-06-23 DIAGNOSIS — R296 Repeated falls: Secondary | ICD-10-CM | POA: Diagnosis not present

## 2016-06-23 DIAGNOSIS — I10 Essential (primary) hypertension: Secondary | ICD-10-CM | POA: Diagnosis not present

## 2016-06-23 DIAGNOSIS — G309 Alzheimer's disease, unspecified: Secondary | ICD-10-CM | POA: Diagnosis not present

## 2016-06-24 DIAGNOSIS — I10 Essential (primary) hypertension: Secondary | ICD-10-CM | POA: Diagnosis not present

## 2016-06-24 DIAGNOSIS — R634 Abnormal weight loss: Secondary | ICD-10-CM | POA: Diagnosis not present

## 2016-06-24 DIAGNOSIS — R269 Unspecified abnormalities of gait and mobility: Secondary | ICD-10-CM | POA: Diagnosis not present

## 2016-06-24 DIAGNOSIS — R296 Repeated falls: Secondary | ICD-10-CM | POA: Diagnosis not present

## 2016-06-24 DIAGNOSIS — F0280 Dementia in other diseases classified elsewhere without behavioral disturbance: Secondary | ICD-10-CM | POA: Diagnosis not present

## 2016-06-24 DIAGNOSIS — G309 Alzheimer's disease, unspecified: Secondary | ICD-10-CM | POA: Diagnosis not present

## 2016-06-28 DIAGNOSIS — F0280 Dementia in other diseases classified elsewhere without behavioral disturbance: Secondary | ICD-10-CM | POA: Diagnosis not present

## 2016-06-28 DIAGNOSIS — I10 Essential (primary) hypertension: Secondary | ICD-10-CM | POA: Diagnosis not present

## 2016-06-28 DIAGNOSIS — R634 Abnormal weight loss: Secondary | ICD-10-CM | POA: Diagnosis not present

## 2016-06-28 DIAGNOSIS — G309 Alzheimer's disease, unspecified: Secondary | ICD-10-CM | POA: Diagnosis not present

## 2016-06-28 DIAGNOSIS — R296 Repeated falls: Secondary | ICD-10-CM | POA: Diagnosis not present

## 2016-06-28 DIAGNOSIS — R269 Unspecified abnormalities of gait and mobility: Secondary | ICD-10-CM | POA: Diagnosis not present

## 2016-06-29 DIAGNOSIS — R634 Abnormal weight loss: Secondary | ICD-10-CM | POA: Diagnosis not present

## 2016-06-29 DIAGNOSIS — R269 Unspecified abnormalities of gait and mobility: Secondary | ICD-10-CM | POA: Diagnosis not present

## 2016-06-29 DIAGNOSIS — R296 Repeated falls: Secondary | ICD-10-CM | POA: Diagnosis not present

## 2016-06-29 DIAGNOSIS — G309 Alzheimer's disease, unspecified: Secondary | ICD-10-CM | POA: Diagnosis not present

## 2016-06-29 DIAGNOSIS — I10 Essential (primary) hypertension: Secondary | ICD-10-CM | POA: Diagnosis not present

## 2016-06-29 DIAGNOSIS — F0280 Dementia in other diseases classified elsewhere without behavioral disturbance: Secondary | ICD-10-CM | POA: Diagnosis not present

## 2016-07-01 DIAGNOSIS — G309 Alzheimer's disease, unspecified: Secondary | ICD-10-CM | POA: Diagnosis not present

## 2016-07-01 DIAGNOSIS — F0280 Dementia in other diseases classified elsewhere without behavioral disturbance: Secondary | ICD-10-CM | POA: Diagnosis not present

## 2016-07-01 DIAGNOSIS — R296 Repeated falls: Secondary | ICD-10-CM | POA: Diagnosis not present

## 2016-07-01 DIAGNOSIS — R269 Unspecified abnormalities of gait and mobility: Secondary | ICD-10-CM | POA: Diagnosis not present

## 2016-07-01 DIAGNOSIS — R634 Abnormal weight loss: Secondary | ICD-10-CM | POA: Diagnosis not present

## 2016-07-01 DIAGNOSIS — I10 Essential (primary) hypertension: Secondary | ICD-10-CM | POA: Diagnosis not present

## 2016-07-05 DIAGNOSIS — R634 Abnormal weight loss: Secondary | ICD-10-CM | POA: Diagnosis not present

## 2016-07-05 DIAGNOSIS — R269 Unspecified abnormalities of gait and mobility: Secondary | ICD-10-CM | POA: Diagnosis not present

## 2016-07-05 DIAGNOSIS — F0280 Dementia in other diseases classified elsewhere without behavioral disturbance: Secondary | ICD-10-CM | POA: Diagnosis not present

## 2016-07-05 DIAGNOSIS — G309 Alzheimer's disease, unspecified: Secondary | ICD-10-CM | POA: Diagnosis not present

## 2016-07-05 DIAGNOSIS — I10 Essential (primary) hypertension: Secondary | ICD-10-CM | POA: Diagnosis not present

## 2016-07-05 DIAGNOSIS — R296 Repeated falls: Secondary | ICD-10-CM | POA: Diagnosis not present

## 2016-07-06 DIAGNOSIS — I10 Essential (primary) hypertension: Secondary | ICD-10-CM | POA: Diagnosis not present

## 2016-07-06 DIAGNOSIS — R296 Repeated falls: Secondary | ICD-10-CM | POA: Diagnosis not present

## 2016-07-06 DIAGNOSIS — R634 Abnormal weight loss: Secondary | ICD-10-CM | POA: Diagnosis not present

## 2016-07-06 DIAGNOSIS — R269 Unspecified abnormalities of gait and mobility: Secondary | ICD-10-CM | POA: Diagnosis not present

## 2016-07-06 DIAGNOSIS — G309 Alzheimer's disease, unspecified: Secondary | ICD-10-CM | POA: Diagnosis not present

## 2016-07-06 DIAGNOSIS — F0280 Dementia in other diseases classified elsewhere without behavioral disturbance: Secondary | ICD-10-CM | POA: Diagnosis not present

## 2016-07-08 DIAGNOSIS — I10 Essential (primary) hypertension: Secondary | ICD-10-CM | POA: Diagnosis not present

## 2016-07-08 DIAGNOSIS — R269 Unspecified abnormalities of gait and mobility: Secondary | ICD-10-CM | POA: Diagnosis not present

## 2016-07-08 DIAGNOSIS — R634 Abnormal weight loss: Secondary | ICD-10-CM | POA: Diagnosis not present

## 2016-07-08 DIAGNOSIS — G309 Alzheimer's disease, unspecified: Secondary | ICD-10-CM | POA: Diagnosis not present

## 2016-07-08 DIAGNOSIS — F0280 Dementia in other diseases classified elsewhere without behavioral disturbance: Secondary | ICD-10-CM | POA: Diagnosis not present

## 2016-07-08 DIAGNOSIS — R296 Repeated falls: Secondary | ICD-10-CM | POA: Diagnosis not present

## 2016-07-12 DIAGNOSIS — R269 Unspecified abnormalities of gait and mobility: Secondary | ICD-10-CM | POA: Diagnosis not present

## 2016-07-12 DIAGNOSIS — R296 Repeated falls: Secondary | ICD-10-CM | POA: Diagnosis not present

## 2016-07-12 DIAGNOSIS — R634 Abnormal weight loss: Secondary | ICD-10-CM | POA: Diagnosis not present

## 2016-07-12 DIAGNOSIS — F0280 Dementia in other diseases classified elsewhere without behavioral disturbance: Secondary | ICD-10-CM | POA: Diagnosis not present

## 2016-07-12 DIAGNOSIS — G309 Alzheimer's disease, unspecified: Secondary | ICD-10-CM | POA: Diagnosis not present

## 2016-07-12 DIAGNOSIS — I10 Essential (primary) hypertension: Secondary | ICD-10-CM | POA: Diagnosis not present

## 2016-07-13 DIAGNOSIS — R296 Repeated falls: Secondary | ICD-10-CM | POA: Diagnosis not present

## 2016-07-13 DIAGNOSIS — I10 Essential (primary) hypertension: Secondary | ICD-10-CM | POA: Diagnosis not present

## 2016-07-13 DIAGNOSIS — G3183 Dementia with Lewy bodies: Secondary | ICD-10-CM | POA: Diagnosis not present

## 2016-07-13 DIAGNOSIS — R634 Abnormal weight loss: Secondary | ICD-10-CM | POA: Diagnosis not present

## 2016-07-13 DIAGNOSIS — R269 Unspecified abnormalities of gait and mobility: Secondary | ICD-10-CM | POA: Diagnosis not present

## 2016-07-13 DIAGNOSIS — F0280 Dementia in other diseases classified elsewhere without behavioral disturbance: Secondary | ICD-10-CM | POA: Diagnosis not present

## 2016-07-13 DIAGNOSIS — G309 Alzheimer's disease, unspecified: Secondary | ICD-10-CM | POA: Diagnosis not present

## 2016-07-15 DIAGNOSIS — R296 Repeated falls: Secondary | ICD-10-CM | POA: Diagnosis not present

## 2016-07-15 DIAGNOSIS — G3183 Dementia with Lewy bodies: Secondary | ICD-10-CM | POA: Diagnosis not present

## 2016-07-15 DIAGNOSIS — I10 Essential (primary) hypertension: Secondary | ICD-10-CM | POA: Diagnosis not present

## 2016-07-15 DIAGNOSIS — R634 Abnormal weight loss: Secondary | ICD-10-CM | POA: Diagnosis not present

## 2016-07-15 DIAGNOSIS — R269 Unspecified abnormalities of gait and mobility: Secondary | ICD-10-CM | POA: Diagnosis not present

## 2016-07-15 DIAGNOSIS — F0280 Dementia in other diseases classified elsewhere without behavioral disturbance: Secondary | ICD-10-CM | POA: Diagnosis not present

## 2016-07-19 DIAGNOSIS — R634 Abnormal weight loss: Secondary | ICD-10-CM | POA: Diagnosis not present

## 2016-07-19 DIAGNOSIS — I10 Essential (primary) hypertension: Secondary | ICD-10-CM | POA: Diagnosis not present

## 2016-07-19 DIAGNOSIS — F0280 Dementia in other diseases classified elsewhere without behavioral disturbance: Secondary | ICD-10-CM | POA: Diagnosis not present

## 2016-07-19 DIAGNOSIS — R296 Repeated falls: Secondary | ICD-10-CM | POA: Diagnosis not present

## 2016-07-19 DIAGNOSIS — R269 Unspecified abnormalities of gait and mobility: Secondary | ICD-10-CM | POA: Diagnosis not present

## 2016-07-19 DIAGNOSIS — G3183 Dementia with Lewy bodies: Secondary | ICD-10-CM | POA: Diagnosis not present

## 2016-07-20 DIAGNOSIS — G3183 Dementia with Lewy bodies: Secondary | ICD-10-CM | POA: Diagnosis not present

## 2016-07-20 DIAGNOSIS — F0280 Dementia in other diseases classified elsewhere without behavioral disturbance: Secondary | ICD-10-CM | POA: Diagnosis not present

## 2016-07-20 DIAGNOSIS — R634 Abnormal weight loss: Secondary | ICD-10-CM | POA: Diagnosis not present

## 2016-07-20 DIAGNOSIS — R296 Repeated falls: Secondary | ICD-10-CM | POA: Diagnosis not present

## 2016-07-20 DIAGNOSIS — I10 Essential (primary) hypertension: Secondary | ICD-10-CM | POA: Diagnosis not present

## 2016-07-20 DIAGNOSIS — R269 Unspecified abnormalities of gait and mobility: Secondary | ICD-10-CM | POA: Diagnosis not present

## 2016-07-21 DIAGNOSIS — R269 Unspecified abnormalities of gait and mobility: Secondary | ICD-10-CM | POA: Diagnosis not present

## 2016-07-21 DIAGNOSIS — R634 Abnormal weight loss: Secondary | ICD-10-CM | POA: Diagnosis not present

## 2016-07-21 DIAGNOSIS — G3183 Dementia with Lewy bodies: Secondary | ICD-10-CM | POA: Diagnosis not present

## 2016-07-21 DIAGNOSIS — F0280 Dementia in other diseases classified elsewhere without behavioral disturbance: Secondary | ICD-10-CM | POA: Diagnosis not present

## 2016-07-21 DIAGNOSIS — R296 Repeated falls: Secondary | ICD-10-CM | POA: Diagnosis not present

## 2016-07-21 DIAGNOSIS — I10 Essential (primary) hypertension: Secondary | ICD-10-CM | POA: Diagnosis not present

## 2016-07-22 DIAGNOSIS — F0280 Dementia in other diseases classified elsewhere without behavioral disturbance: Secondary | ICD-10-CM | POA: Diagnosis not present

## 2016-07-22 DIAGNOSIS — G3183 Dementia with Lewy bodies: Secondary | ICD-10-CM | POA: Diagnosis not present

## 2016-07-22 DIAGNOSIS — R296 Repeated falls: Secondary | ICD-10-CM | POA: Diagnosis not present

## 2016-07-22 DIAGNOSIS — R634 Abnormal weight loss: Secondary | ICD-10-CM | POA: Diagnosis not present

## 2016-07-22 DIAGNOSIS — R269 Unspecified abnormalities of gait and mobility: Secondary | ICD-10-CM | POA: Diagnosis not present

## 2016-07-22 DIAGNOSIS — I10 Essential (primary) hypertension: Secondary | ICD-10-CM | POA: Diagnosis not present

## 2016-07-26 DIAGNOSIS — R634 Abnormal weight loss: Secondary | ICD-10-CM | POA: Diagnosis not present

## 2016-07-26 DIAGNOSIS — R269 Unspecified abnormalities of gait and mobility: Secondary | ICD-10-CM | POA: Diagnosis not present

## 2016-07-26 DIAGNOSIS — I10 Essential (primary) hypertension: Secondary | ICD-10-CM | POA: Diagnosis not present

## 2016-07-26 DIAGNOSIS — F0280 Dementia in other diseases classified elsewhere without behavioral disturbance: Secondary | ICD-10-CM | POA: Diagnosis not present

## 2016-07-26 DIAGNOSIS — R296 Repeated falls: Secondary | ICD-10-CM | POA: Diagnosis not present

## 2016-07-26 DIAGNOSIS — G3183 Dementia with Lewy bodies: Secondary | ICD-10-CM | POA: Diagnosis not present

## 2016-07-27 DIAGNOSIS — R296 Repeated falls: Secondary | ICD-10-CM | POA: Diagnosis not present

## 2016-07-27 DIAGNOSIS — F0280 Dementia in other diseases classified elsewhere without behavioral disturbance: Secondary | ICD-10-CM | POA: Diagnosis not present

## 2016-07-27 DIAGNOSIS — I10 Essential (primary) hypertension: Secondary | ICD-10-CM | POA: Diagnosis not present

## 2016-07-27 DIAGNOSIS — G3183 Dementia with Lewy bodies: Secondary | ICD-10-CM | POA: Diagnosis not present

## 2016-07-27 DIAGNOSIS — R269 Unspecified abnormalities of gait and mobility: Secondary | ICD-10-CM | POA: Diagnosis not present

## 2016-07-27 DIAGNOSIS — R634 Abnormal weight loss: Secondary | ICD-10-CM | POA: Diagnosis not present

## 2016-07-29 DIAGNOSIS — F0280 Dementia in other diseases classified elsewhere without behavioral disturbance: Secondary | ICD-10-CM | POA: Diagnosis not present

## 2016-07-29 DIAGNOSIS — R269 Unspecified abnormalities of gait and mobility: Secondary | ICD-10-CM | POA: Diagnosis not present

## 2016-07-29 DIAGNOSIS — G3183 Dementia with Lewy bodies: Secondary | ICD-10-CM | POA: Diagnosis not present

## 2016-07-29 DIAGNOSIS — R634 Abnormal weight loss: Secondary | ICD-10-CM | POA: Diagnosis not present

## 2016-07-29 DIAGNOSIS — R296 Repeated falls: Secondary | ICD-10-CM | POA: Diagnosis not present

## 2016-07-29 DIAGNOSIS — I10 Essential (primary) hypertension: Secondary | ICD-10-CM | POA: Diagnosis not present

## 2016-08-02 DIAGNOSIS — R269 Unspecified abnormalities of gait and mobility: Secondary | ICD-10-CM | POA: Diagnosis not present

## 2016-08-02 DIAGNOSIS — I10 Essential (primary) hypertension: Secondary | ICD-10-CM | POA: Diagnosis not present

## 2016-08-02 DIAGNOSIS — G3183 Dementia with Lewy bodies: Secondary | ICD-10-CM | POA: Diagnosis not present

## 2016-08-02 DIAGNOSIS — R634 Abnormal weight loss: Secondary | ICD-10-CM | POA: Diagnosis not present

## 2016-08-02 DIAGNOSIS — R296 Repeated falls: Secondary | ICD-10-CM | POA: Diagnosis not present

## 2016-08-02 DIAGNOSIS — F0280 Dementia in other diseases classified elsewhere without behavioral disturbance: Secondary | ICD-10-CM | POA: Diagnosis not present

## 2016-08-03 DIAGNOSIS — R634 Abnormal weight loss: Secondary | ICD-10-CM | POA: Diagnosis not present

## 2016-08-03 DIAGNOSIS — R296 Repeated falls: Secondary | ICD-10-CM | POA: Diagnosis not present

## 2016-08-03 DIAGNOSIS — F0280 Dementia in other diseases classified elsewhere without behavioral disturbance: Secondary | ICD-10-CM | POA: Diagnosis not present

## 2016-08-03 DIAGNOSIS — R269 Unspecified abnormalities of gait and mobility: Secondary | ICD-10-CM | POA: Diagnosis not present

## 2016-08-03 DIAGNOSIS — G3183 Dementia with Lewy bodies: Secondary | ICD-10-CM | POA: Diagnosis not present

## 2016-08-03 DIAGNOSIS — I10 Essential (primary) hypertension: Secondary | ICD-10-CM | POA: Diagnosis not present

## 2016-08-09 DIAGNOSIS — R269 Unspecified abnormalities of gait and mobility: Secondary | ICD-10-CM | POA: Diagnosis not present

## 2016-08-09 DIAGNOSIS — F0280 Dementia in other diseases classified elsewhere without behavioral disturbance: Secondary | ICD-10-CM | POA: Diagnosis not present

## 2016-08-09 DIAGNOSIS — R296 Repeated falls: Secondary | ICD-10-CM | POA: Diagnosis not present

## 2016-08-09 DIAGNOSIS — R634 Abnormal weight loss: Secondary | ICD-10-CM | POA: Diagnosis not present

## 2016-08-09 DIAGNOSIS — G3183 Dementia with Lewy bodies: Secondary | ICD-10-CM | POA: Diagnosis not present

## 2016-08-09 DIAGNOSIS — I10 Essential (primary) hypertension: Secondary | ICD-10-CM | POA: Diagnosis not present

## 2016-08-10 DIAGNOSIS — R634 Abnormal weight loss: Secondary | ICD-10-CM | POA: Diagnosis not present

## 2016-08-10 DIAGNOSIS — I10 Essential (primary) hypertension: Secondary | ICD-10-CM | POA: Diagnosis not present

## 2016-08-10 DIAGNOSIS — G3183 Dementia with Lewy bodies: Secondary | ICD-10-CM | POA: Diagnosis not present

## 2016-08-10 DIAGNOSIS — R296 Repeated falls: Secondary | ICD-10-CM | POA: Diagnosis not present

## 2016-08-10 DIAGNOSIS — R269 Unspecified abnormalities of gait and mobility: Secondary | ICD-10-CM | POA: Diagnosis not present

## 2016-08-10 DIAGNOSIS — F0280 Dementia in other diseases classified elsewhere without behavioral disturbance: Secondary | ICD-10-CM | POA: Diagnosis not present

## 2016-08-12 DIAGNOSIS — R269 Unspecified abnormalities of gait and mobility: Secondary | ICD-10-CM | POA: Diagnosis not present

## 2016-08-12 DIAGNOSIS — F0280 Dementia in other diseases classified elsewhere without behavioral disturbance: Secondary | ICD-10-CM | POA: Diagnosis not present

## 2016-08-12 DIAGNOSIS — I10 Essential (primary) hypertension: Secondary | ICD-10-CM | POA: Diagnosis not present

## 2016-08-12 DIAGNOSIS — G3183 Dementia with Lewy bodies: Secondary | ICD-10-CM | POA: Diagnosis not present

## 2016-08-12 DIAGNOSIS — R296 Repeated falls: Secondary | ICD-10-CM | POA: Diagnosis not present

## 2016-08-12 DIAGNOSIS — R634 Abnormal weight loss: Secondary | ICD-10-CM | POA: Diagnosis not present

## 2016-08-13 DIAGNOSIS — R634 Abnormal weight loss: Secondary | ICD-10-CM | POA: Diagnosis not present

## 2016-08-13 DIAGNOSIS — F0280 Dementia in other diseases classified elsewhere without behavioral disturbance: Secondary | ICD-10-CM | POA: Diagnosis not present

## 2016-08-13 DIAGNOSIS — G3183 Dementia with Lewy bodies: Secondary | ICD-10-CM | POA: Diagnosis not present

## 2016-08-13 DIAGNOSIS — I10 Essential (primary) hypertension: Secondary | ICD-10-CM | POA: Diagnosis not present

## 2016-08-13 DIAGNOSIS — R269 Unspecified abnormalities of gait and mobility: Secondary | ICD-10-CM | POA: Diagnosis not present

## 2016-08-13 DIAGNOSIS — R296 Repeated falls: Secondary | ICD-10-CM | POA: Diagnosis not present

## 2016-08-16 DIAGNOSIS — G3183 Dementia with Lewy bodies: Secondary | ICD-10-CM | POA: Diagnosis not present

## 2016-08-16 DIAGNOSIS — F0280 Dementia in other diseases classified elsewhere without behavioral disturbance: Secondary | ICD-10-CM | POA: Diagnosis not present

## 2016-08-16 DIAGNOSIS — R269 Unspecified abnormalities of gait and mobility: Secondary | ICD-10-CM | POA: Diagnosis not present

## 2016-08-16 DIAGNOSIS — I10 Essential (primary) hypertension: Secondary | ICD-10-CM | POA: Diagnosis not present

## 2016-08-16 DIAGNOSIS — R634 Abnormal weight loss: Secondary | ICD-10-CM | POA: Diagnosis not present

## 2016-08-16 DIAGNOSIS — R296 Repeated falls: Secondary | ICD-10-CM | POA: Diagnosis not present

## 2016-08-17 DIAGNOSIS — R296 Repeated falls: Secondary | ICD-10-CM | POA: Diagnosis not present

## 2016-08-17 DIAGNOSIS — R634 Abnormal weight loss: Secondary | ICD-10-CM | POA: Diagnosis not present

## 2016-08-17 DIAGNOSIS — I10 Essential (primary) hypertension: Secondary | ICD-10-CM | POA: Diagnosis not present

## 2016-08-17 DIAGNOSIS — F0280 Dementia in other diseases classified elsewhere without behavioral disturbance: Secondary | ICD-10-CM | POA: Diagnosis not present

## 2016-08-17 DIAGNOSIS — G3183 Dementia with Lewy bodies: Secondary | ICD-10-CM | POA: Diagnosis not present

## 2016-08-17 DIAGNOSIS — R269 Unspecified abnormalities of gait and mobility: Secondary | ICD-10-CM | POA: Diagnosis not present

## 2016-08-19 DIAGNOSIS — F0280 Dementia in other diseases classified elsewhere without behavioral disturbance: Secondary | ICD-10-CM | POA: Diagnosis not present

## 2016-08-19 DIAGNOSIS — R296 Repeated falls: Secondary | ICD-10-CM | POA: Diagnosis not present

## 2016-08-19 DIAGNOSIS — I10 Essential (primary) hypertension: Secondary | ICD-10-CM | POA: Diagnosis not present

## 2016-08-19 DIAGNOSIS — R269 Unspecified abnormalities of gait and mobility: Secondary | ICD-10-CM | POA: Diagnosis not present

## 2016-08-19 DIAGNOSIS — R634 Abnormal weight loss: Secondary | ICD-10-CM | POA: Diagnosis not present

## 2016-08-19 DIAGNOSIS — G3183 Dementia with Lewy bodies: Secondary | ICD-10-CM | POA: Diagnosis not present

## 2016-08-22 ENCOUNTER — Ambulatory Visit: Payer: Medicare Other | Admitting: Internal Medicine

## 2016-08-22 DIAGNOSIS — R634 Abnormal weight loss: Secondary | ICD-10-CM | POA: Diagnosis not present

## 2016-08-22 DIAGNOSIS — F0280 Dementia in other diseases classified elsewhere without behavioral disturbance: Secondary | ICD-10-CM | POA: Diagnosis not present

## 2016-08-22 DIAGNOSIS — R296 Repeated falls: Secondary | ICD-10-CM | POA: Diagnosis not present

## 2016-08-22 DIAGNOSIS — R269 Unspecified abnormalities of gait and mobility: Secondary | ICD-10-CM | POA: Diagnosis not present

## 2016-08-22 DIAGNOSIS — I10 Essential (primary) hypertension: Secondary | ICD-10-CM | POA: Diagnosis not present

## 2016-08-22 DIAGNOSIS — G3183 Dementia with Lewy bodies: Secondary | ICD-10-CM | POA: Diagnosis not present

## 2016-08-23 DIAGNOSIS — R634 Abnormal weight loss: Secondary | ICD-10-CM | POA: Diagnosis not present

## 2016-08-23 DIAGNOSIS — F0280 Dementia in other diseases classified elsewhere without behavioral disturbance: Secondary | ICD-10-CM | POA: Diagnosis not present

## 2016-08-23 DIAGNOSIS — G3183 Dementia with Lewy bodies: Secondary | ICD-10-CM | POA: Diagnosis not present

## 2016-08-23 DIAGNOSIS — R269 Unspecified abnormalities of gait and mobility: Secondary | ICD-10-CM | POA: Diagnosis not present

## 2016-08-23 DIAGNOSIS — R296 Repeated falls: Secondary | ICD-10-CM | POA: Diagnosis not present

## 2016-08-23 DIAGNOSIS — I10 Essential (primary) hypertension: Secondary | ICD-10-CM | POA: Diagnosis not present

## 2016-08-24 DIAGNOSIS — I10 Essential (primary) hypertension: Secondary | ICD-10-CM | POA: Diagnosis not present

## 2016-08-24 DIAGNOSIS — F0280 Dementia in other diseases classified elsewhere without behavioral disturbance: Secondary | ICD-10-CM | POA: Diagnosis not present

## 2016-08-24 DIAGNOSIS — R634 Abnormal weight loss: Secondary | ICD-10-CM | POA: Diagnosis not present

## 2016-08-24 DIAGNOSIS — G3183 Dementia with Lewy bodies: Secondary | ICD-10-CM | POA: Diagnosis not present

## 2016-08-24 DIAGNOSIS — R269 Unspecified abnormalities of gait and mobility: Secondary | ICD-10-CM | POA: Diagnosis not present

## 2016-08-24 DIAGNOSIS — R296 Repeated falls: Secondary | ICD-10-CM | POA: Diagnosis not present

## 2016-08-26 DIAGNOSIS — I10 Essential (primary) hypertension: Secondary | ICD-10-CM | POA: Diagnosis not present

## 2016-08-26 DIAGNOSIS — R634 Abnormal weight loss: Secondary | ICD-10-CM | POA: Diagnosis not present

## 2016-08-26 DIAGNOSIS — F0280 Dementia in other diseases classified elsewhere without behavioral disturbance: Secondary | ICD-10-CM | POA: Diagnosis not present

## 2016-08-26 DIAGNOSIS — R269 Unspecified abnormalities of gait and mobility: Secondary | ICD-10-CM | POA: Diagnosis not present

## 2016-08-26 DIAGNOSIS — R296 Repeated falls: Secondary | ICD-10-CM | POA: Diagnosis not present

## 2016-08-26 DIAGNOSIS — G3183 Dementia with Lewy bodies: Secondary | ICD-10-CM | POA: Diagnosis not present

## 2016-08-30 DIAGNOSIS — R269 Unspecified abnormalities of gait and mobility: Secondary | ICD-10-CM | POA: Diagnosis not present

## 2016-08-30 DIAGNOSIS — R634 Abnormal weight loss: Secondary | ICD-10-CM | POA: Diagnosis not present

## 2016-08-30 DIAGNOSIS — F0280 Dementia in other diseases classified elsewhere without behavioral disturbance: Secondary | ICD-10-CM | POA: Diagnosis not present

## 2016-08-30 DIAGNOSIS — G3183 Dementia with Lewy bodies: Secondary | ICD-10-CM | POA: Diagnosis not present

## 2016-08-30 DIAGNOSIS — R296 Repeated falls: Secondary | ICD-10-CM | POA: Diagnosis not present

## 2016-08-30 DIAGNOSIS — I10 Essential (primary) hypertension: Secondary | ICD-10-CM | POA: Diagnosis not present

## 2016-08-31 DIAGNOSIS — F0280 Dementia in other diseases classified elsewhere without behavioral disturbance: Secondary | ICD-10-CM | POA: Diagnosis not present

## 2016-08-31 DIAGNOSIS — R634 Abnormal weight loss: Secondary | ICD-10-CM | POA: Diagnosis not present

## 2016-08-31 DIAGNOSIS — R296 Repeated falls: Secondary | ICD-10-CM | POA: Diagnosis not present

## 2016-08-31 DIAGNOSIS — I10 Essential (primary) hypertension: Secondary | ICD-10-CM | POA: Diagnosis not present

## 2016-08-31 DIAGNOSIS — G3183 Dementia with Lewy bodies: Secondary | ICD-10-CM | POA: Diagnosis not present

## 2016-08-31 DIAGNOSIS — R269 Unspecified abnormalities of gait and mobility: Secondary | ICD-10-CM | POA: Diagnosis not present

## 2016-09-02 DIAGNOSIS — R634 Abnormal weight loss: Secondary | ICD-10-CM | POA: Diagnosis not present

## 2016-09-02 DIAGNOSIS — F0280 Dementia in other diseases classified elsewhere without behavioral disturbance: Secondary | ICD-10-CM | POA: Diagnosis not present

## 2016-09-02 DIAGNOSIS — R296 Repeated falls: Secondary | ICD-10-CM | POA: Diagnosis not present

## 2016-09-02 DIAGNOSIS — G3183 Dementia with Lewy bodies: Secondary | ICD-10-CM | POA: Diagnosis not present

## 2016-09-02 DIAGNOSIS — R269 Unspecified abnormalities of gait and mobility: Secondary | ICD-10-CM | POA: Diagnosis not present

## 2016-09-02 DIAGNOSIS — I10 Essential (primary) hypertension: Secondary | ICD-10-CM | POA: Diagnosis not present

## 2016-09-06 DIAGNOSIS — F0280 Dementia in other diseases classified elsewhere without behavioral disturbance: Secondary | ICD-10-CM | POA: Diagnosis not present

## 2016-09-06 DIAGNOSIS — I10 Essential (primary) hypertension: Secondary | ICD-10-CM | POA: Diagnosis not present

## 2016-09-06 DIAGNOSIS — R634 Abnormal weight loss: Secondary | ICD-10-CM | POA: Diagnosis not present

## 2016-09-06 DIAGNOSIS — R269 Unspecified abnormalities of gait and mobility: Secondary | ICD-10-CM | POA: Diagnosis not present

## 2016-09-06 DIAGNOSIS — G3183 Dementia with Lewy bodies: Secondary | ICD-10-CM | POA: Diagnosis not present

## 2016-09-06 DIAGNOSIS — R296 Repeated falls: Secondary | ICD-10-CM | POA: Diagnosis not present

## 2016-09-07 DIAGNOSIS — R634 Abnormal weight loss: Secondary | ICD-10-CM | POA: Diagnosis not present

## 2016-09-07 DIAGNOSIS — R296 Repeated falls: Secondary | ICD-10-CM | POA: Diagnosis not present

## 2016-09-07 DIAGNOSIS — I10 Essential (primary) hypertension: Secondary | ICD-10-CM | POA: Diagnosis not present

## 2016-09-07 DIAGNOSIS — G3183 Dementia with Lewy bodies: Secondary | ICD-10-CM | POA: Diagnosis not present

## 2016-09-07 DIAGNOSIS — F0280 Dementia in other diseases classified elsewhere without behavioral disturbance: Secondary | ICD-10-CM | POA: Diagnosis not present

## 2016-09-07 DIAGNOSIS — R269 Unspecified abnormalities of gait and mobility: Secondary | ICD-10-CM | POA: Diagnosis not present

## 2016-09-09 DIAGNOSIS — I10 Essential (primary) hypertension: Secondary | ICD-10-CM | POA: Diagnosis not present

## 2016-09-09 DIAGNOSIS — F0280 Dementia in other diseases classified elsewhere without behavioral disturbance: Secondary | ICD-10-CM | POA: Diagnosis not present

## 2016-09-09 DIAGNOSIS — R634 Abnormal weight loss: Secondary | ICD-10-CM | POA: Diagnosis not present

## 2016-09-09 DIAGNOSIS — R296 Repeated falls: Secondary | ICD-10-CM | POA: Diagnosis not present

## 2016-09-09 DIAGNOSIS — R269 Unspecified abnormalities of gait and mobility: Secondary | ICD-10-CM | POA: Diagnosis not present

## 2016-09-09 DIAGNOSIS — G3183 Dementia with Lewy bodies: Secondary | ICD-10-CM | POA: Diagnosis not present

## 2016-09-12 DIAGNOSIS — R269 Unspecified abnormalities of gait and mobility: Secondary | ICD-10-CM | POA: Diagnosis not present

## 2016-09-12 DIAGNOSIS — R296 Repeated falls: Secondary | ICD-10-CM | POA: Diagnosis not present

## 2016-09-12 DIAGNOSIS — R634 Abnormal weight loss: Secondary | ICD-10-CM | POA: Diagnosis not present

## 2016-09-12 DIAGNOSIS — I10 Essential (primary) hypertension: Secondary | ICD-10-CM | POA: Diagnosis not present

## 2016-09-12 DIAGNOSIS — G3183 Dementia with Lewy bodies: Secondary | ICD-10-CM | POA: Diagnosis not present

## 2016-09-12 DIAGNOSIS — F0280 Dementia in other diseases classified elsewhere without behavioral disturbance: Secondary | ICD-10-CM | POA: Diagnosis not present

## 2016-09-13 DIAGNOSIS — R296 Repeated falls: Secondary | ICD-10-CM | POA: Diagnosis not present

## 2016-09-13 DIAGNOSIS — F0280 Dementia in other diseases classified elsewhere without behavioral disturbance: Secondary | ICD-10-CM | POA: Diagnosis not present

## 2016-09-13 DIAGNOSIS — G3183 Dementia with Lewy bodies: Secondary | ICD-10-CM | POA: Diagnosis not present

## 2016-09-13 DIAGNOSIS — I10 Essential (primary) hypertension: Secondary | ICD-10-CM | POA: Diagnosis not present

## 2016-09-13 DIAGNOSIS — R269 Unspecified abnormalities of gait and mobility: Secondary | ICD-10-CM | POA: Diagnosis not present

## 2016-09-13 DIAGNOSIS — R634 Abnormal weight loss: Secondary | ICD-10-CM | POA: Diagnosis not present

## 2016-09-14 DIAGNOSIS — G3183 Dementia with Lewy bodies: Secondary | ICD-10-CM | POA: Diagnosis not present

## 2016-09-14 DIAGNOSIS — R634 Abnormal weight loss: Secondary | ICD-10-CM | POA: Diagnosis not present

## 2016-09-14 DIAGNOSIS — F0280 Dementia in other diseases classified elsewhere without behavioral disturbance: Secondary | ICD-10-CM | POA: Diagnosis not present

## 2016-09-14 DIAGNOSIS — I10 Essential (primary) hypertension: Secondary | ICD-10-CM | POA: Diagnosis not present

## 2016-09-14 DIAGNOSIS — R269 Unspecified abnormalities of gait and mobility: Secondary | ICD-10-CM | POA: Diagnosis not present

## 2016-09-14 DIAGNOSIS — R296 Repeated falls: Secondary | ICD-10-CM | POA: Diagnosis not present

## 2016-09-16 DIAGNOSIS — R634 Abnormal weight loss: Secondary | ICD-10-CM | POA: Diagnosis not present

## 2016-09-16 DIAGNOSIS — G3183 Dementia with Lewy bodies: Secondary | ICD-10-CM | POA: Diagnosis not present

## 2016-09-16 DIAGNOSIS — R269 Unspecified abnormalities of gait and mobility: Secondary | ICD-10-CM | POA: Diagnosis not present

## 2016-09-16 DIAGNOSIS — I10 Essential (primary) hypertension: Secondary | ICD-10-CM | POA: Diagnosis not present

## 2016-09-16 DIAGNOSIS — R296 Repeated falls: Secondary | ICD-10-CM | POA: Diagnosis not present

## 2016-09-16 DIAGNOSIS — F0280 Dementia in other diseases classified elsewhere without behavioral disturbance: Secondary | ICD-10-CM | POA: Diagnosis not present

## 2016-09-20 DIAGNOSIS — R269 Unspecified abnormalities of gait and mobility: Secondary | ICD-10-CM | POA: Diagnosis not present

## 2016-09-20 DIAGNOSIS — G3183 Dementia with Lewy bodies: Secondary | ICD-10-CM | POA: Diagnosis not present

## 2016-09-20 DIAGNOSIS — I10 Essential (primary) hypertension: Secondary | ICD-10-CM | POA: Diagnosis not present

## 2016-09-20 DIAGNOSIS — F0280 Dementia in other diseases classified elsewhere without behavioral disturbance: Secondary | ICD-10-CM | POA: Diagnosis not present

## 2016-09-20 DIAGNOSIS — R634 Abnormal weight loss: Secondary | ICD-10-CM | POA: Diagnosis not present

## 2016-09-20 DIAGNOSIS — R296 Repeated falls: Secondary | ICD-10-CM | POA: Diagnosis not present

## 2016-09-21 DIAGNOSIS — I10 Essential (primary) hypertension: Secondary | ICD-10-CM | POA: Diagnosis not present

## 2016-09-21 DIAGNOSIS — R634 Abnormal weight loss: Secondary | ICD-10-CM | POA: Diagnosis not present

## 2016-09-21 DIAGNOSIS — R269 Unspecified abnormalities of gait and mobility: Secondary | ICD-10-CM | POA: Diagnosis not present

## 2016-09-21 DIAGNOSIS — G3183 Dementia with Lewy bodies: Secondary | ICD-10-CM | POA: Diagnosis not present

## 2016-09-21 DIAGNOSIS — F0280 Dementia in other diseases classified elsewhere without behavioral disturbance: Secondary | ICD-10-CM | POA: Diagnosis not present

## 2016-09-21 DIAGNOSIS — R296 Repeated falls: Secondary | ICD-10-CM | POA: Diagnosis not present

## 2016-09-23 DIAGNOSIS — R269 Unspecified abnormalities of gait and mobility: Secondary | ICD-10-CM | POA: Diagnosis not present

## 2016-09-23 DIAGNOSIS — F0280 Dementia in other diseases classified elsewhere without behavioral disturbance: Secondary | ICD-10-CM | POA: Diagnosis not present

## 2016-09-23 DIAGNOSIS — I10 Essential (primary) hypertension: Secondary | ICD-10-CM | POA: Diagnosis not present

## 2016-09-23 DIAGNOSIS — R296 Repeated falls: Secondary | ICD-10-CM | POA: Diagnosis not present

## 2016-09-23 DIAGNOSIS — R634 Abnormal weight loss: Secondary | ICD-10-CM | POA: Diagnosis not present

## 2016-09-23 DIAGNOSIS — G3183 Dementia with Lewy bodies: Secondary | ICD-10-CM | POA: Diagnosis not present

## 2016-09-27 DIAGNOSIS — R296 Repeated falls: Secondary | ICD-10-CM | POA: Diagnosis not present

## 2016-09-27 DIAGNOSIS — G3183 Dementia with Lewy bodies: Secondary | ICD-10-CM | POA: Diagnosis not present

## 2016-09-27 DIAGNOSIS — I10 Essential (primary) hypertension: Secondary | ICD-10-CM | POA: Diagnosis not present

## 2016-09-27 DIAGNOSIS — F0280 Dementia in other diseases classified elsewhere without behavioral disturbance: Secondary | ICD-10-CM | POA: Diagnosis not present

## 2016-09-27 DIAGNOSIS — R269 Unspecified abnormalities of gait and mobility: Secondary | ICD-10-CM | POA: Diagnosis not present

## 2016-09-27 DIAGNOSIS — R634 Abnormal weight loss: Secondary | ICD-10-CM | POA: Diagnosis not present

## 2016-09-28 DIAGNOSIS — I10 Essential (primary) hypertension: Secondary | ICD-10-CM | POA: Diagnosis not present

## 2016-09-28 DIAGNOSIS — R269 Unspecified abnormalities of gait and mobility: Secondary | ICD-10-CM | POA: Diagnosis not present

## 2016-09-28 DIAGNOSIS — G3183 Dementia with Lewy bodies: Secondary | ICD-10-CM | POA: Diagnosis not present

## 2016-09-28 DIAGNOSIS — R296 Repeated falls: Secondary | ICD-10-CM | POA: Diagnosis not present

## 2016-09-28 DIAGNOSIS — R634 Abnormal weight loss: Secondary | ICD-10-CM | POA: Diagnosis not present

## 2016-09-28 DIAGNOSIS — F0280 Dementia in other diseases classified elsewhere without behavioral disturbance: Secondary | ICD-10-CM | POA: Diagnosis not present

## 2016-09-29 DIAGNOSIS — R296 Repeated falls: Secondary | ICD-10-CM | POA: Diagnosis not present

## 2016-09-29 DIAGNOSIS — I10 Essential (primary) hypertension: Secondary | ICD-10-CM | POA: Diagnosis not present

## 2016-09-29 DIAGNOSIS — F0280 Dementia in other diseases classified elsewhere without behavioral disturbance: Secondary | ICD-10-CM | POA: Diagnosis not present

## 2016-09-29 DIAGNOSIS — G3183 Dementia with Lewy bodies: Secondary | ICD-10-CM | POA: Diagnosis not present

## 2016-09-29 DIAGNOSIS — R634 Abnormal weight loss: Secondary | ICD-10-CM | POA: Diagnosis not present

## 2016-09-29 DIAGNOSIS — R269 Unspecified abnormalities of gait and mobility: Secondary | ICD-10-CM | POA: Diagnosis not present

## 2016-09-30 DIAGNOSIS — R296 Repeated falls: Secondary | ICD-10-CM | POA: Diagnosis not present

## 2016-09-30 DIAGNOSIS — G3183 Dementia with Lewy bodies: Secondary | ICD-10-CM | POA: Diagnosis not present

## 2016-09-30 DIAGNOSIS — R634 Abnormal weight loss: Secondary | ICD-10-CM | POA: Diagnosis not present

## 2016-09-30 DIAGNOSIS — I10 Essential (primary) hypertension: Secondary | ICD-10-CM | POA: Diagnosis not present

## 2016-09-30 DIAGNOSIS — R269 Unspecified abnormalities of gait and mobility: Secondary | ICD-10-CM | POA: Diagnosis not present

## 2016-09-30 DIAGNOSIS — F0280 Dementia in other diseases classified elsewhere without behavioral disturbance: Secondary | ICD-10-CM | POA: Diagnosis not present

## 2016-10-04 DIAGNOSIS — R269 Unspecified abnormalities of gait and mobility: Secondary | ICD-10-CM | POA: Diagnosis not present

## 2016-10-04 DIAGNOSIS — R296 Repeated falls: Secondary | ICD-10-CM | POA: Diagnosis not present

## 2016-10-04 DIAGNOSIS — F0280 Dementia in other diseases classified elsewhere without behavioral disturbance: Secondary | ICD-10-CM | POA: Diagnosis not present

## 2016-10-04 DIAGNOSIS — G3183 Dementia with Lewy bodies: Secondary | ICD-10-CM | POA: Diagnosis not present

## 2016-10-04 DIAGNOSIS — R634 Abnormal weight loss: Secondary | ICD-10-CM | POA: Diagnosis not present

## 2016-10-04 DIAGNOSIS — I10 Essential (primary) hypertension: Secondary | ICD-10-CM | POA: Diagnosis not present

## 2016-10-05 DIAGNOSIS — I10 Essential (primary) hypertension: Secondary | ICD-10-CM | POA: Diagnosis not present

## 2016-10-05 DIAGNOSIS — R634 Abnormal weight loss: Secondary | ICD-10-CM | POA: Diagnosis not present

## 2016-10-05 DIAGNOSIS — R269 Unspecified abnormalities of gait and mobility: Secondary | ICD-10-CM | POA: Diagnosis not present

## 2016-10-05 DIAGNOSIS — G3183 Dementia with Lewy bodies: Secondary | ICD-10-CM | POA: Diagnosis not present

## 2016-10-05 DIAGNOSIS — R296 Repeated falls: Secondary | ICD-10-CM | POA: Diagnosis not present

## 2016-10-05 DIAGNOSIS — F0280 Dementia in other diseases classified elsewhere without behavioral disturbance: Secondary | ICD-10-CM | POA: Diagnosis not present

## 2016-10-11 DIAGNOSIS — I10 Essential (primary) hypertension: Secondary | ICD-10-CM | POA: Diagnosis not present

## 2016-10-11 DIAGNOSIS — F0280 Dementia in other diseases classified elsewhere without behavioral disturbance: Secondary | ICD-10-CM | POA: Diagnosis not present

## 2016-10-11 DIAGNOSIS — G3183 Dementia with Lewy bodies: Secondary | ICD-10-CM | POA: Diagnosis not present

## 2016-10-11 DIAGNOSIS — R269 Unspecified abnormalities of gait and mobility: Secondary | ICD-10-CM | POA: Diagnosis not present

## 2016-10-11 DIAGNOSIS — R296 Repeated falls: Secondary | ICD-10-CM | POA: Diagnosis not present

## 2016-10-11 DIAGNOSIS — R634 Abnormal weight loss: Secondary | ICD-10-CM | POA: Diagnosis not present

## 2016-10-12 DIAGNOSIS — G3183 Dementia with Lewy bodies: Secondary | ICD-10-CM | POA: Diagnosis not present

## 2016-10-12 DIAGNOSIS — R296 Repeated falls: Secondary | ICD-10-CM | POA: Diagnosis not present

## 2016-10-12 DIAGNOSIS — I10 Essential (primary) hypertension: Secondary | ICD-10-CM | POA: Diagnosis not present

## 2016-10-12 DIAGNOSIS — R634 Abnormal weight loss: Secondary | ICD-10-CM | POA: Diagnosis not present

## 2016-10-12 DIAGNOSIS — F0280 Dementia in other diseases classified elsewhere without behavioral disturbance: Secondary | ICD-10-CM | POA: Diagnosis not present

## 2016-10-12 DIAGNOSIS — R269 Unspecified abnormalities of gait and mobility: Secondary | ICD-10-CM | POA: Diagnosis not present

## 2016-10-13 DIAGNOSIS — I10 Essential (primary) hypertension: Secondary | ICD-10-CM | POA: Diagnosis not present

## 2016-10-13 DIAGNOSIS — R296 Repeated falls: Secondary | ICD-10-CM | POA: Diagnosis not present

## 2016-10-13 DIAGNOSIS — R634 Abnormal weight loss: Secondary | ICD-10-CM | POA: Diagnosis not present

## 2016-10-13 DIAGNOSIS — G3183 Dementia with Lewy bodies: Secondary | ICD-10-CM | POA: Diagnosis not present

## 2016-10-13 DIAGNOSIS — R269 Unspecified abnormalities of gait and mobility: Secondary | ICD-10-CM | POA: Diagnosis not present

## 2016-10-13 DIAGNOSIS — F0280 Dementia in other diseases classified elsewhere without behavioral disturbance: Secondary | ICD-10-CM | POA: Diagnosis not present

## 2016-10-14 DIAGNOSIS — I10 Essential (primary) hypertension: Secondary | ICD-10-CM | POA: Diagnosis not present

## 2016-10-14 DIAGNOSIS — R296 Repeated falls: Secondary | ICD-10-CM | POA: Diagnosis not present

## 2016-10-14 DIAGNOSIS — F0280 Dementia in other diseases classified elsewhere without behavioral disturbance: Secondary | ICD-10-CM | POA: Diagnosis not present

## 2016-10-14 DIAGNOSIS — R634 Abnormal weight loss: Secondary | ICD-10-CM | POA: Diagnosis not present

## 2016-10-14 DIAGNOSIS — G3183 Dementia with Lewy bodies: Secondary | ICD-10-CM | POA: Diagnosis not present

## 2016-10-14 DIAGNOSIS — R269 Unspecified abnormalities of gait and mobility: Secondary | ICD-10-CM | POA: Diagnosis not present

## 2016-10-18 DIAGNOSIS — F0280 Dementia in other diseases classified elsewhere without behavioral disturbance: Secondary | ICD-10-CM | POA: Diagnosis not present

## 2016-10-18 DIAGNOSIS — R269 Unspecified abnormalities of gait and mobility: Secondary | ICD-10-CM | POA: Diagnosis not present

## 2016-10-18 DIAGNOSIS — R296 Repeated falls: Secondary | ICD-10-CM | POA: Diagnosis not present

## 2016-10-18 DIAGNOSIS — I10 Essential (primary) hypertension: Secondary | ICD-10-CM | POA: Diagnosis not present

## 2016-10-18 DIAGNOSIS — G3183 Dementia with Lewy bodies: Secondary | ICD-10-CM | POA: Diagnosis not present

## 2016-10-18 DIAGNOSIS — R634 Abnormal weight loss: Secondary | ICD-10-CM | POA: Diagnosis not present

## 2016-10-19 DIAGNOSIS — R269 Unspecified abnormalities of gait and mobility: Secondary | ICD-10-CM | POA: Diagnosis not present

## 2016-10-19 DIAGNOSIS — I10 Essential (primary) hypertension: Secondary | ICD-10-CM | POA: Diagnosis not present

## 2016-10-19 DIAGNOSIS — G3183 Dementia with Lewy bodies: Secondary | ICD-10-CM | POA: Diagnosis not present

## 2016-10-19 DIAGNOSIS — R634 Abnormal weight loss: Secondary | ICD-10-CM | POA: Diagnosis not present

## 2016-10-19 DIAGNOSIS — R296 Repeated falls: Secondary | ICD-10-CM | POA: Diagnosis not present

## 2016-10-19 DIAGNOSIS — F0280 Dementia in other diseases classified elsewhere without behavioral disturbance: Secondary | ICD-10-CM | POA: Diagnosis not present

## 2016-10-21 DIAGNOSIS — G3183 Dementia with Lewy bodies: Secondary | ICD-10-CM | POA: Diagnosis not present

## 2016-10-21 DIAGNOSIS — I10 Essential (primary) hypertension: Secondary | ICD-10-CM | POA: Diagnosis not present

## 2016-10-21 DIAGNOSIS — F0280 Dementia in other diseases classified elsewhere without behavioral disturbance: Secondary | ICD-10-CM | POA: Diagnosis not present

## 2016-10-21 DIAGNOSIS — R296 Repeated falls: Secondary | ICD-10-CM | POA: Diagnosis not present

## 2016-10-21 DIAGNOSIS — R634 Abnormal weight loss: Secondary | ICD-10-CM | POA: Diagnosis not present

## 2016-10-21 DIAGNOSIS — R269 Unspecified abnormalities of gait and mobility: Secondary | ICD-10-CM | POA: Diagnosis not present

## 2016-10-25 DIAGNOSIS — G3183 Dementia with Lewy bodies: Secondary | ICD-10-CM | POA: Diagnosis not present

## 2016-10-25 DIAGNOSIS — R296 Repeated falls: Secondary | ICD-10-CM | POA: Diagnosis not present

## 2016-10-25 DIAGNOSIS — F0280 Dementia in other diseases classified elsewhere without behavioral disturbance: Secondary | ICD-10-CM | POA: Diagnosis not present

## 2016-10-25 DIAGNOSIS — I10 Essential (primary) hypertension: Secondary | ICD-10-CM | POA: Diagnosis not present

## 2016-10-25 DIAGNOSIS — R634 Abnormal weight loss: Secondary | ICD-10-CM | POA: Diagnosis not present

## 2016-10-25 DIAGNOSIS — R269 Unspecified abnormalities of gait and mobility: Secondary | ICD-10-CM | POA: Diagnosis not present

## 2016-10-27 DIAGNOSIS — G3183 Dementia with Lewy bodies: Secondary | ICD-10-CM | POA: Diagnosis not present

## 2016-10-27 DIAGNOSIS — R296 Repeated falls: Secondary | ICD-10-CM | POA: Diagnosis not present

## 2016-10-27 DIAGNOSIS — S72009A Fracture of unspecified part of neck of unspecified femur, initial encounter for closed fracture: Secondary | ICD-10-CM | POA: Diagnosis not present

## 2016-10-27 DIAGNOSIS — R269 Unspecified abnormalities of gait and mobility: Secondary | ICD-10-CM | POA: Diagnosis not present

## 2016-10-27 DIAGNOSIS — F0280 Dementia in other diseases classified elsewhere without behavioral disturbance: Secondary | ICD-10-CM | POA: Diagnosis not present

## 2016-10-27 DIAGNOSIS — M79602 Pain in left arm: Secondary | ICD-10-CM | POA: Diagnosis not present

## 2016-10-27 DIAGNOSIS — R4182 Altered mental status, unspecified: Secondary | ICD-10-CM | POA: Diagnosis not present

## 2016-10-27 DIAGNOSIS — R634 Abnormal weight loss: Secondary | ICD-10-CM | POA: Diagnosis not present

## 2016-10-27 DIAGNOSIS — M25532 Pain in left wrist: Secondary | ICD-10-CM | POA: Diagnosis not present

## 2016-10-27 DIAGNOSIS — I10 Essential (primary) hypertension: Secondary | ICD-10-CM | POA: Diagnosis not present

## 2016-10-28 DIAGNOSIS — G3183 Dementia with Lewy bodies: Secondary | ICD-10-CM | POA: Diagnosis not present

## 2016-10-28 DIAGNOSIS — R269 Unspecified abnormalities of gait and mobility: Secondary | ICD-10-CM | POA: Diagnosis not present

## 2016-10-28 DIAGNOSIS — R296 Repeated falls: Secondary | ICD-10-CM | POA: Diagnosis not present

## 2016-10-28 DIAGNOSIS — F0280 Dementia in other diseases classified elsewhere without behavioral disturbance: Secondary | ICD-10-CM | POA: Diagnosis not present

## 2016-10-28 DIAGNOSIS — R634 Abnormal weight loss: Secondary | ICD-10-CM | POA: Diagnosis not present

## 2016-10-28 DIAGNOSIS — I10 Essential (primary) hypertension: Secondary | ICD-10-CM | POA: Diagnosis not present

## 2016-10-30 DIAGNOSIS — I10 Essential (primary) hypertension: Secondary | ICD-10-CM | POA: Diagnosis not present

## 2016-10-30 DIAGNOSIS — R634 Abnormal weight loss: Secondary | ICD-10-CM | POA: Diagnosis not present

## 2016-10-30 DIAGNOSIS — G3183 Dementia with Lewy bodies: Secondary | ICD-10-CM | POA: Diagnosis not present

## 2016-10-30 DIAGNOSIS — R296 Repeated falls: Secondary | ICD-10-CM | POA: Diagnosis not present

## 2016-10-30 DIAGNOSIS — R269 Unspecified abnormalities of gait and mobility: Secondary | ICD-10-CM | POA: Diagnosis not present

## 2016-10-30 DIAGNOSIS — F0280 Dementia in other diseases classified elsewhere without behavioral disturbance: Secondary | ICD-10-CM | POA: Diagnosis not present

## 2016-11-01 DIAGNOSIS — R296 Repeated falls: Secondary | ICD-10-CM | POA: Diagnosis not present

## 2016-11-01 DIAGNOSIS — F0280 Dementia in other diseases classified elsewhere without behavioral disturbance: Secondary | ICD-10-CM | POA: Diagnosis not present

## 2016-11-01 DIAGNOSIS — R634 Abnormal weight loss: Secondary | ICD-10-CM | POA: Diagnosis not present

## 2016-11-01 DIAGNOSIS — I10 Essential (primary) hypertension: Secondary | ICD-10-CM | POA: Diagnosis not present

## 2016-11-01 DIAGNOSIS — G3183 Dementia with Lewy bodies: Secondary | ICD-10-CM | POA: Diagnosis not present

## 2016-11-01 DIAGNOSIS — R269 Unspecified abnormalities of gait and mobility: Secondary | ICD-10-CM | POA: Diagnosis not present

## 2016-11-02 DIAGNOSIS — R296 Repeated falls: Secondary | ICD-10-CM | POA: Diagnosis not present

## 2016-11-02 DIAGNOSIS — I10 Essential (primary) hypertension: Secondary | ICD-10-CM | POA: Diagnosis not present

## 2016-11-02 DIAGNOSIS — G3183 Dementia with Lewy bodies: Secondary | ICD-10-CM | POA: Diagnosis not present

## 2016-11-02 DIAGNOSIS — F0280 Dementia in other diseases classified elsewhere without behavioral disturbance: Secondary | ICD-10-CM | POA: Diagnosis not present

## 2016-11-02 DIAGNOSIS — R269 Unspecified abnormalities of gait and mobility: Secondary | ICD-10-CM | POA: Diagnosis not present

## 2016-11-02 DIAGNOSIS — R634 Abnormal weight loss: Secondary | ICD-10-CM | POA: Diagnosis not present

## 2016-11-04 DIAGNOSIS — F0280 Dementia in other diseases classified elsewhere without behavioral disturbance: Secondary | ICD-10-CM | POA: Diagnosis not present

## 2016-11-04 DIAGNOSIS — R269 Unspecified abnormalities of gait and mobility: Secondary | ICD-10-CM | POA: Diagnosis not present

## 2016-11-04 DIAGNOSIS — R634 Abnormal weight loss: Secondary | ICD-10-CM | POA: Diagnosis not present

## 2016-11-04 DIAGNOSIS — G3183 Dementia with Lewy bodies: Secondary | ICD-10-CM | POA: Diagnosis not present

## 2016-11-04 DIAGNOSIS — I10 Essential (primary) hypertension: Secondary | ICD-10-CM | POA: Diagnosis not present

## 2016-11-04 DIAGNOSIS — R296 Repeated falls: Secondary | ICD-10-CM | POA: Diagnosis not present

## 2016-11-06 DIAGNOSIS — R634 Abnormal weight loss: Secondary | ICD-10-CM | POA: Diagnosis not present

## 2016-11-06 DIAGNOSIS — R296 Repeated falls: Secondary | ICD-10-CM | POA: Diagnosis not present

## 2016-11-06 DIAGNOSIS — F0280 Dementia in other diseases classified elsewhere without behavioral disturbance: Secondary | ICD-10-CM | POA: Diagnosis not present

## 2016-11-06 DIAGNOSIS — I10 Essential (primary) hypertension: Secondary | ICD-10-CM | POA: Diagnosis not present

## 2016-11-06 DIAGNOSIS — G3183 Dementia with Lewy bodies: Secondary | ICD-10-CM | POA: Diagnosis not present

## 2016-11-06 DIAGNOSIS — R269 Unspecified abnormalities of gait and mobility: Secondary | ICD-10-CM | POA: Diagnosis not present

## 2016-11-08 DIAGNOSIS — F0280 Dementia in other diseases classified elsewhere without behavioral disturbance: Secondary | ICD-10-CM | POA: Diagnosis not present

## 2016-11-08 DIAGNOSIS — G3183 Dementia with Lewy bodies: Secondary | ICD-10-CM | POA: Diagnosis not present

## 2016-11-08 DIAGNOSIS — R634 Abnormal weight loss: Secondary | ICD-10-CM | POA: Diagnosis not present

## 2016-11-08 DIAGNOSIS — I10 Essential (primary) hypertension: Secondary | ICD-10-CM | POA: Diagnosis not present

## 2016-11-08 DIAGNOSIS — R269 Unspecified abnormalities of gait and mobility: Secondary | ICD-10-CM | POA: Diagnosis not present

## 2016-11-08 DIAGNOSIS — R296 Repeated falls: Secondary | ICD-10-CM | POA: Diagnosis not present

## 2016-11-11 DIAGNOSIS — G3183 Dementia with Lewy bodies: Secondary | ICD-10-CM | POA: Diagnosis not present

## 2016-11-11 DIAGNOSIS — R296 Repeated falls: Secondary | ICD-10-CM | POA: Diagnosis not present

## 2016-11-11 DIAGNOSIS — R269 Unspecified abnormalities of gait and mobility: Secondary | ICD-10-CM | POA: Diagnosis not present

## 2016-11-11 DIAGNOSIS — F0280 Dementia in other diseases classified elsewhere without behavioral disturbance: Secondary | ICD-10-CM | POA: Diagnosis not present

## 2016-11-11 DIAGNOSIS — R634 Abnormal weight loss: Secondary | ICD-10-CM | POA: Diagnosis not present

## 2016-11-11 DIAGNOSIS — I10 Essential (primary) hypertension: Secondary | ICD-10-CM | POA: Diagnosis not present

## 2016-11-12 DIAGNOSIS — I10 Essential (primary) hypertension: Secondary | ICD-10-CM | POA: Diagnosis not present

## 2016-11-12 DIAGNOSIS — R1310 Dysphagia, unspecified: Secondary | ICD-10-CM | POA: Diagnosis not present

## 2016-11-12 DIAGNOSIS — F0280 Dementia in other diseases classified elsewhere without behavioral disturbance: Secondary | ICD-10-CM | POA: Diagnosis not present

## 2016-11-12 DIAGNOSIS — R269 Unspecified abnormalities of gait and mobility: Secondary | ICD-10-CM | POA: Diagnosis not present

## 2016-11-12 DIAGNOSIS — R634 Abnormal weight loss: Secondary | ICD-10-CM | POA: Diagnosis not present

## 2016-11-12 DIAGNOSIS — R296 Repeated falls: Secondary | ICD-10-CM | POA: Diagnosis not present

## 2016-11-12 DIAGNOSIS — G3183 Dementia with Lewy bodies: Secondary | ICD-10-CM | POA: Diagnosis not present

## 2016-11-14 DIAGNOSIS — R269 Unspecified abnormalities of gait and mobility: Secondary | ICD-10-CM | POA: Diagnosis not present

## 2016-11-14 DIAGNOSIS — R296 Repeated falls: Secondary | ICD-10-CM | POA: Diagnosis not present

## 2016-11-14 DIAGNOSIS — R634 Abnormal weight loss: Secondary | ICD-10-CM | POA: Diagnosis not present

## 2016-11-14 DIAGNOSIS — G3183 Dementia with Lewy bodies: Secondary | ICD-10-CM | POA: Diagnosis not present

## 2016-11-14 DIAGNOSIS — R1310 Dysphagia, unspecified: Secondary | ICD-10-CM | POA: Diagnosis not present

## 2016-11-14 DIAGNOSIS — F0280 Dementia in other diseases classified elsewhere without behavioral disturbance: Secondary | ICD-10-CM | POA: Diagnosis not present

## 2016-11-16 DIAGNOSIS — R269 Unspecified abnormalities of gait and mobility: Secondary | ICD-10-CM | POA: Diagnosis not present

## 2016-11-16 DIAGNOSIS — R634 Abnormal weight loss: Secondary | ICD-10-CM | POA: Diagnosis not present

## 2016-11-16 DIAGNOSIS — F0280 Dementia in other diseases classified elsewhere without behavioral disturbance: Secondary | ICD-10-CM | POA: Diagnosis not present

## 2016-11-16 DIAGNOSIS — G3183 Dementia with Lewy bodies: Secondary | ICD-10-CM | POA: Diagnosis not present

## 2016-11-16 DIAGNOSIS — R1310 Dysphagia, unspecified: Secondary | ICD-10-CM | POA: Diagnosis not present

## 2016-11-16 DIAGNOSIS — R296 Repeated falls: Secondary | ICD-10-CM | POA: Diagnosis not present

## 2016-11-18 DIAGNOSIS — R296 Repeated falls: Secondary | ICD-10-CM | POA: Diagnosis not present

## 2016-11-18 DIAGNOSIS — R634 Abnormal weight loss: Secondary | ICD-10-CM | POA: Diagnosis not present

## 2016-11-18 DIAGNOSIS — G3183 Dementia with Lewy bodies: Secondary | ICD-10-CM | POA: Diagnosis not present

## 2016-11-18 DIAGNOSIS — R269 Unspecified abnormalities of gait and mobility: Secondary | ICD-10-CM | POA: Diagnosis not present

## 2016-11-18 DIAGNOSIS — F0280 Dementia in other diseases classified elsewhere without behavioral disturbance: Secondary | ICD-10-CM | POA: Diagnosis not present

## 2016-11-18 DIAGNOSIS — R1310 Dysphagia, unspecified: Secondary | ICD-10-CM | POA: Diagnosis not present

## 2016-11-22 DIAGNOSIS — R1310 Dysphagia, unspecified: Secondary | ICD-10-CM | POA: Diagnosis not present

## 2016-11-22 DIAGNOSIS — R296 Repeated falls: Secondary | ICD-10-CM | POA: Diagnosis not present

## 2016-11-22 DIAGNOSIS — R269 Unspecified abnormalities of gait and mobility: Secondary | ICD-10-CM | POA: Diagnosis not present

## 2016-11-22 DIAGNOSIS — F0280 Dementia in other diseases classified elsewhere without behavioral disturbance: Secondary | ICD-10-CM | POA: Diagnosis not present

## 2016-11-22 DIAGNOSIS — G3183 Dementia with Lewy bodies: Secondary | ICD-10-CM | POA: Diagnosis not present

## 2016-11-22 DIAGNOSIS — R634 Abnormal weight loss: Secondary | ICD-10-CM | POA: Diagnosis not present

## 2016-11-23 DIAGNOSIS — R269 Unspecified abnormalities of gait and mobility: Secondary | ICD-10-CM | POA: Diagnosis not present

## 2016-11-23 DIAGNOSIS — R634 Abnormal weight loss: Secondary | ICD-10-CM | POA: Diagnosis not present

## 2016-11-23 DIAGNOSIS — G3183 Dementia with Lewy bodies: Secondary | ICD-10-CM | POA: Diagnosis not present

## 2016-11-23 DIAGNOSIS — R1310 Dysphagia, unspecified: Secondary | ICD-10-CM | POA: Diagnosis not present

## 2016-11-23 DIAGNOSIS — R296 Repeated falls: Secondary | ICD-10-CM | POA: Diagnosis not present

## 2016-11-23 DIAGNOSIS — F0280 Dementia in other diseases classified elsewhere without behavioral disturbance: Secondary | ICD-10-CM | POA: Diagnosis not present

## 2016-11-25 DIAGNOSIS — R1310 Dysphagia, unspecified: Secondary | ICD-10-CM | POA: Diagnosis not present

## 2016-11-25 DIAGNOSIS — F0280 Dementia in other diseases classified elsewhere without behavioral disturbance: Secondary | ICD-10-CM | POA: Diagnosis not present

## 2016-11-25 DIAGNOSIS — R296 Repeated falls: Secondary | ICD-10-CM | POA: Diagnosis not present

## 2016-11-25 DIAGNOSIS — R269 Unspecified abnormalities of gait and mobility: Secondary | ICD-10-CM | POA: Diagnosis not present

## 2016-11-25 DIAGNOSIS — R634 Abnormal weight loss: Secondary | ICD-10-CM | POA: Diagnosis not present

## 2016-11-25 DIAGNOSIS — G3183 Dementia with Lewy bodies: Secondary | ICD-10-CM | POA: Diagnosis not present

## 2016-11-28 DIAGNOSIS — R1310 Dysphagia, unspecified: Secondary | ICD-10-CM | POA: Diagnosis not present

## 2016-11-28 DIAGNOSIS — R296 Repeated falls: Secondary | ICD-10-CM | POA: Diagnosis not present

## 2016-11-28 DIAGNOSIS — G3183 Dementia with Lewy bodies: Secondary | ICD-10-CM | POA: Diagnosis not present

## 2016-11-28 DIAGNOSIS — R269 Unspecified abnormalities of gait and mobility: Secondary | ICD-10-CM | POA: Diagnosis not present

## 2016-11-28 DIAGNOSIS — R634 Abnormal weight loss: Secondary | ICD-10-CM | POA: Diagnosis not present

## 2016-11-28 DIAGNOSIS — F0280 Dementia in other diseases classified elsewhere without behavioral disturbance: Secondary | ICD-10-CM | POA: Diagnosis not present

## 2016-11-29 DIAGNOSIS — R296 Repeated falls: Secondary | ICD-10-CM | POA: Diagnosis not present

## 2016-11-29 DIAGNOSIS — R269 Unspecified abnormalities of gait and mobility: Secondary | ICD-10-CM | POA: Diagnosis not present

## 2016-11-29 DIAGNOSIS — F0280 Dementia in other diseases classified elsewhere without behavioral disturbance: Secondary | ICD-10-CM | POA: Diagnosis not present

## 2016-11-29 DIAGNOSIS — G3183 Dementia with Lewy bodies: Secondary | ICD-10-CM | POA: Diagnosis not present

## 2016-11-29 DIAGNOSIS — R634 Abnormal weight loss: Secondary | ICD-10-CM | POA: Diagnosis not present

## 2016-11-29 DIAGNOSIS — R1310 Dysphagia, unspecified: Secondary | ICD-10-CM | POA: Diagnosis not present

## 2016-11-30 DIAGNOSIS — R634 Abnormal weight loss: Secondary | ICD-10-CM | POA: Diagnosis not present

## 2016-11-30 DIAGNOSIS — R296 Repeated falls: Secondary | ICD-10-CM | POA: Diagnosis not present

## 2016-11-30 DIAGNOSIS — R1310 Dysphagia, unspecified: Secondary | ICD-10-CM | POA: Diagnosis not present

## 2016-11-30 DIAGNOSIS — F0280 Dementia in other diseases classified elsewhere without behavioral disturbance: Secondary | ICD-10-CM | POA: Diagnosis not present

## 2016-11-30 DIAGNOSIS — R269 Unspecified abnormalities of gait and mobility: Secondary | ICD-10-CM | POA: Diagnosis not present

## 2016-11-30 DIAGNOSIS — G3183 Dementia with Lewy bodies: Secondary | ICD-10-CM | POA: Diagnosis not present

## 2016-12-02 DIAGNOSIS — R634 Abnormal weight loss: Secondary | ICD-10-CM | POA: Diagnosis not present

## 2016-12-02 DIAGNOSIS — G3183 Dementia with Lewy bodies: Secondary | ICD-10-CM | POA: Diagnosis not present

## 2016-12-02 DIAGNOSIS — R296 Repeated falls: Secondary | ICD-10-CM | POA: Diagnosis not present

## 2016-12-02 DIAGNOSIS — R1310 Dysphagia, unspecified: Secondary | ICD-10-CM | POA: Diagnosis not present

## 2016-12-02 DIAGNOSIS — R269 Unspecified abnormalities of gait and mobility: Secondary | ICD-10-CM | POA: Diagnosis not present

## 2016-12-02 DIAGNOSIS — F0280 Dementia in other diseases classified elsewhere without behavioral disturbance: Secondary | ICD-10-CM | POA: Diagnosis not present

## 2016-12-06 DIAGNOSIS — R634 Abnormal weight loss: Secondary | ICD-10-CM | POA: Diagnosis not present

## 2016-12-06 DIAGNOSIS — G3183 Dementia with Lewy bodies: Secondary | ICD-10-CM | POA: Diagnosis not present

## 2016-12-06 DIAGNOSIS — F0280 Dementia in other diseases classified elsewhere without behavioral disturbance: Secondary | ICD-10-CM | POA: Diagnosis not present

## 2016-12-06 DIAGNOSIS — R269 Unspecified abnormalities of gait and mobility: Secondary | ICD-10-CM | POA: Diagnosis not present

## 2016-12-06 DIAGNOSIS — R296 Repeated falls: Secondary | ICD-10-CM | POA: Diagnosis not present

## 2016-12-06 DIAGNOSIS — R1310 Dysphagia, unspecified: Secondary | ICD-10-CM | POA: Diagnosis not present

## 2016-12-07 DIAGNOSIS — R1310 Dysphagia, unspecified: Secondary | ICD-10-CM | POA: Diagnosis not present

## 2016-12-07 DIAGNOSIS — R296 Repeated falls: Secondary | ICD-10-CM | POA: Diagnosis not present

## 2016-12-07 DIAGNOSIS — G3183 Dementia with Lewy bodies: Secondary | ICD-10-CM | POA: Diagnosis not present

## 2016-12-07 DIAGNOSIS — F0280 Dementia in other diseases classified elsewhere without behavioral disturbance: Secondary | ICD-10-CM | POA: Diagnosis not present

## 2016-12-07 DIAGNOSIS — R269 Unspecified abnormalities of gait and mobility: Secondary | ICD-10-CM | POA: Diagnosis not present

## 2016-12-07 DIAGNOSIS — R634 Abnormal weight loss: Secondary | ICD-10-CM | POA: Diagnosis not present

## 2016-12-09 DIAGNOSIS — R1310 Dysphagia, unspecified: Secondary | ICD-10-CM | POA: Diagnosis not present

## 2016-12-09 DIAGNOSIS — R269 Unspecified abnormalities of gait and mobility: Secondary | ICD-10-CM | POA: Diagnosis not present

## 2016-12-09 DIAGNOSIS — G3183 Dementia with Lewy bodies: Secondary | ICD-10-CM | POA: Diagnosis not present

## 2016-12-09 DIAGNOSIS — R634 Abnormal weight loss: Secondary | ICD-10-CM | POA: Diagnosis not present

## 2016-12-09 DIAGNOSIS — F0280 Dementia in other diseases classified elsewhere without behavioral disturbance: Secondary | ICD-10-CM | POA: Diagnosis not present

## 2016-12-09 DIAGNOSIS — R296 Repeated falls: Secondary | ICD-10-CM | POA: Diagnosis not present

## 2016-12-13 DIAGNOSIS — R296 Repeated falls: Secondary | ICD-10-CM | POA: Diagnosis not present

## 2016-12-13 DIAGNOSIS — G3183 Dementia with Lewy bodies: Secondary | ICD-10-CM | POA: Diagnosis not present

## 2016-12-13 DIAGNOSIS — R634 Abnormal weight loss: Secondary | ICD-10-CM | POA: Diagnosis not present

## 2016-12-13 DIAGNOSIS — L89320 Pressure ulcer of left buttock, unstageable: Secondary | ICD-10-CM | POA: Diagnosis not present

## 2016-12-13 DIAGNOSIS — R269 Unspecified abnormalities of gait and mobility: Secondary | ICD-10-CM | POA: Diagnosis not present

## 2016-12-13 DIAGNOSIS — F0280 Dementia in other diseases classified elsewhere without behavioral disturbance: Secondary | ICD-10-CM | POA: Diagnosis not present

## 2016-12-13 DIAGNOSIS — R1310 Dysphagia, unspecified: Secondary | ICD-10-CM | POA: Diagnosis not present

## 2016-12-13 DIAGNOSIS — I10 Essential (primary) hypertension: Secondary | ICD-10-CM | POA: Diagnosis not present

## 2016-12-14 DIAGNOSIS — L89320 Pressure ulcer of left buttock, unstageable: Secondary | ICD-10-CM | POA: Diagnosis not present

## 2016-12-14 DIAGNOSIS — R296 Repeated falls: Secondary | ICD-10-CM | POA: Diagnosis not present

## 2016-12-14 DIAGNOSIS — G3183 Dementia with Lewy bodies: Secondary | ICD-10-CM | POA: Diagnosis not present

## 2016-12-14 DIAGNOSIS — F0280 Dementia in other diseases classified elsewhere without behavioral disturbance: Secondary | ICD-10-CM | POA: Diagnosis not present

## 2016-12-14 DIAGNOSIS — R634 Abnormal weight loss: Secondary | ICD-10-CM | POA: Diagnosis not present

## 2016-12-14 DIAGNOSIS — R1310 Dysphagia, unspecified: Secondary | ICD-10-CM | POA: Diagnosis not present

## 2016-12-16 DIAGNOSIS — R634 Abnormal weight loss: Secondary | ICD-10-CM | POA: Diagnosis not present

## 2016-12-16 DIAGNOSIS — G3183 Dementia with Lewy bodies: Secondary | ICD-10-CM | POA: Diagnosis not present

## 2016-12-16 DIAGNOSIS — R296 Repeated falls: Secondary | ICD-10-CM | POA: Diagnosis not present

## 2016-12-16 DIAGNOSIS — R1310 Dysphagia, unspecified: Secondary | ICD-10-CM | POA: Diagnosis not present

## 2016-12-16 DIAGNOSIS — L89320 Pressure ulcer of left buttock, unstageable: Secondary | ICD-10-CM | POA: Diagnosis not present

## 2016-12-16 DIAGNOSIS — F0280 Dementia in other diseases classified elsewhere without behavioral disturbance: Secondary | ICD-10-CM | POA: Diagnosis not present

## 2016-12-20 DIAGNOSIS — L89320 Pressure ulcer of left buttock, unstageable: Secondary | ICD-10-CM | POA: Diagnosis not present

## 2016-12-20 DIAGNOSIS — R1310 Dysphagia, unspecified: Secondary | ICD-10-CM | POA: Diagnosis not present

## 2016-12-20 DIAGNOSIS — G3183 Dementia with Lewy bodies: Secondary | ICD-10-CM | POA: Diagnosis not present

## 2016-12-20 DIAGNOSIS — F0280 Dementia in other diseases classified elsewhere without behavioral disturbance: Secondary | ICD-10-CM | POA: Diagnosis not present

## 2016-12-20 DIAGNOSIS — R296 Repeated falls: Secondary | ICD-10-CM | POA: Diagnosis not present

## 2016-12-20 DIAGNOSIS — R634 Abnormal weight loss: Secondary | ICD-10-CM | POA: Diagnosis not present

## 2016-12-21 DIAGNOSIS — R634 Abnormal weight loss: Secondary | ICD-10-CM | POA: Diagnosis not present

## 2016-12-21 DIAGNOSIS — L89320 Pressure ulcer of left buttock, unstageable: Secondary | ICD-10-CM | POA: Diagnosis not present

## 2016-12-21 DIAGNOSIS — R296 Repeated falls: Secondary | ICD-10-CM | POA: Diagnosis not present

## 2016-12-21 DIAGNOSIS — F0280 Dementia in other diseases classified elsewhere without behavioral disturbance: Secondary | ICD-10-CM | POA: Diagnosis not present

## 2016-12-21 DIAGNOSIS — R1310 Dysphagia, unspecified: Secondary | ICD-10-CM | POA: Diagnosis not present

## 2016-12-21 DIAGNOSIS — G3183 Dementia with Lewy bodies: Secondary | ICD-10-CM | POA: Diagnosis not present

## 2016-12-22 DIAGNOSIS — R634 Abnormal weight loss: Secondary | ICD-10-CM | POA: Diagnosis not present

## 2016-12-22 DIAGNOSIS — G3183 Dementia with Lewy bodies: Secondary | ICD-10-CM | POA: Diagnosis not present

## 2016-12-22 DIAGNOSIS — R1310 Dysphagia, unspecified: Secondary | ICD-10-CM | POA: Diagnosis not present

## 2016-12-22 DIAGNOSIS — F0280 Dementia in other diseases classified elsewhere without behavioral disturbance: Secondary | ICD-10-CM | POA: Diagnosis not present

## 2016-12-22 DIAGNOSIS — R296 Repeated falls: Secondary | ICD-10-CM | POA: Diagnosis not present

## 2016-12-22 DIAGNOSIS — L89320 Pressure ulcer of left buttock, unstageable: Secondary | ICD-10-CM | POA: Diagnosis not present

## 2016-12-23 DIAGNOSIS — R296 Repeated falls: Secondary | ICD-10-CM | POA: Diagnosis not present

## 2016-12-23 DIAGNOSIS — R634 Abnormal weight loss: Secondary | ICD-10-CM | POA: Diagnosis not present

## 2016-12-23 DIAGNOSIS — F0280 Dementia in other diseases classified elsewhere without behavioral disturbance: Secondary | ICD-10-CM | POA: Diagnosis not present

## 2016-12-23 DIAGNOSIS — L89320 Pressure ulcer of left buttock, unstageable: Secondary | ICD-10-CM | POA: Diagnosis not present

## 2016-12-23 DIAGNOSIS — G3183 Dementia with Lewy bodies: Secondary | ICD-10-CM | POA: Diagnosis not present

## 2016-12-23 DIAGNOSIS — R1310 Dysphagia, unspecified: Secondary | ICD-10-CM | POA: Diagnosis not present

## 2016-12-24 DIAGNOSIS — R1310 Dysphagia, unspecified: Secondary | ICD-10-CM | POA: Diagnosis not present

## 2016-12-24 DIAGNOSIS — R296 Repeated falls: Secondary | ICD-10-CM | POA: Diagnosis not present

## 2016-12-24 DIAGNOSIS — R634 Abnormal weight loss: Secondary | ICD-10-CM | POA: Diagnosis not present

## 2016-12-24 DIAGNOSIS — G3183 Dementia with Lewy bodies: Secondary | ICD-10-CM | POA: Diagnosis not present

## 2016-12-24 DIAGNOSIS — L89320 Pressure ulcer of left buttock, unstageable: Secondary | ICD-10-CM | POA: Diagnosis not present

## 2016-12-24 DIAGNOSIS — F0280 Dementia in other diseases classified elsewhere without behavioral disturbance: Secondary | ICD-10-CM | POA: Diagnosis not present

## 2016-12-25 DIAGNOSIS — R634 Abnormal weight loss: Secondary | ICD-10-CM | POA: Diagnosis not present

## 2016-12-25 DIAGNOSIS — R296 Repeated falls: Secondary | ICD-10-CM | POA: Diagnosis not present

## 2016-12-25 DIAGNOSIS — G3183 Dementia with Lewy bodies: Secondary | ICD-10-CM | POA: Diagnosis not present

## 2016-12-25 DIAGNOSIS — L89320 Pressure ulcer of left buttock, unstageable: Secondary | ICD-10-CM | POA: Diagnosis not present

## 2016-12-25 DIAGNOSIS — F0280 Dementia in other diseases classified elsewhere without behavioral disturbance: Secondary | ICD-10-CM | POA: Diagnosis not present

## 2016-12-25 DIAGNOSIS — R1310 Dysphagia, unspecified: Secondary | ICD-10-CM | POA: Diagnosis not present

## 2016-12-26 DIAGNOSIS — R634 Abnormal weight loss: Secondary | ICD-10-CM | POA: Diagnosis not present

## 2016-12-26 DIAGNOSIS — G3183 Dementia with Lewy bodies: Secondary | ICD-10-CM | POA: Diagnosis not present

## 2016-12-26 DIAGNOSIS — R296 Repeated falls: Secondary | ICD-10-CM | POA: Diagnosis not present

## 2016-12-26 DIAGNOSIS — R1310 Dysphagia, unspecified: Secondary | ICD-10-CM | POA: Diagnosis not present

## 2016-12-26 DIAGNOSIS — L89320 Pressure ulcer of left buttock, unstageable: Secondary | ICD-10-CM | POA: Diagnosis not present

## 2016-12-26 DIAGNOSIS — F0280 Dementia in other diseases classified elsewhere without behavioral disturbance: Secondary | ICD-10-CM | POA: Diagnosis not present

## 2016-12-27 DIAGNOSIS — F0280 Dementia in other diseases classified elsewhere without behavioral disturbance: Secondary | ICD-10-CM | POA: Diagnosis not present

## 2016-12-27 DIAGNOSIS — R296 Repeated falls: Secondary | ICD-10-CM | POA: Diagnosis not present

## 2016-12-27 DIAGNOSIS — R634 Abnormal weight loss: Secondary | ICD-10-CM | POA: Diagnosis not present

## 2016-12-27 DIAGNOSIS — R1310 Dysphagia, unspecified: Secondary | ICD-10-CM | POA: Diagnosis not present

## 2016-12-27 DIAGNOSIS — L89320 Pressure ulcer of left buttock, unstageable: Secondary | ICD-10-CM | POA: Diagnosis not present

## 2016-12-27 DIAGNOSIS — G3183 Dementia with Lewy bodies: Secondary | ICD-10-CM | POA: Diagnosis not present

## 2016-12-28 DIAGNOSIS — F0280 Dementia in other diseases classified elsewhere without behavioral disturbance: Secondary | ICD-10-CM | POA: Diagnosis not present

## 2016-12-28 DIAGNOSIS — R296 Repeated falls: Secondary | ICD-10-CM | POA: Diagnosis not present

## 2016-12-28 DIAGNOSIS — R634 Abnormal weight loss: Secondary | ICD-10-CM | POA: Diagnosis not present

## 2016-12-28 DIAGNOSIS — G3183 Dementia with Lewy bodies: Secondary | ICD-10-CM | POA: Diagnosis not present

## 2016-12-28 DIAGNOSIS — L89320 Pressure ulcer of left buttock, unstageable: Secondary | ICD-10-CM | POA: Diagnosis not present

## 2016-12-28 DIAGNOSIS — R1310 Dysphagia, unspecified: Secondary | ICD-10-CM | POA: Diagnosis not present

## 2016-12-29 DIAGNOSIS — G3183 Dementia with Lewy bodies: Secondary | ICD-10-CM | POA: Diagnosis not present

## 2016-12-29 DIAGNOSIS — R1310 Dysphagia, unspecified: Secondary | ICD-10-CM | POA: Diagnosis not present

## 2016-12-29 DIAGNOSIS — R634 Abnormal weight loss: Secondary | ICD-10-CM | POA: Diagnosis not present

## 2016-12-29 DIAGNOSIS — R296 Repeated falls: Secondary | ICD-10-CM | POA: Diagnosis not present

## 2016-12-29 DIAGNOSIS — F0280 Dementia in other diseases classified elsewhere without behavioral disturbance: Secondary | ICD-10-CM | POA: Diagnosis not present

## 2016-12-29 DIAGNOSIS — L89320 Pressure ulcer of left buttock, unstageable: Secondary | ICD-10-CM | POA: Diagnosis not present

## 2016-12-30 DIAGNOSIS — L89320 Pressure ulcer of left buttock, unstageable: Secondary | ICD-10-CM | POA: Diagnosis not present

## 2016-12-30 DIAGNOSIS — G3183 Dementia with Lewy bodies: Secondary | ICD-10-CM | POA: Diagnosis not present

## 2016-12-30 DIAGNOSIS — R1310 Dysphagia, unspecified: Secondary | ICD-10-CM | POA: Diagnosis not present

## 2016-12-30 DIAGNOSIS — R634 Abnormal weight loss: Secondary | ICD-10-CM | POA: Diagnosis not present

## 2016-12-30 DIAGNOSIS — R296 Repeated falls: Secondary | ICD-10-CM | POA: Diagnosis not present

## 2016-12-30 DIAGNOSIS — F0280 Dementia in other diseases classified elsewhere without behavioral disturbance: Secondary | ICD-10-CM | POA: Diagnosis not present

## 2017-01-03 DIAGNOSIS — G3183 Dementia with Lewy bodies: Secondary | ICD-10-CM | POA: Diagnosis not present

## 2017-01-03 DIAGNOSIS — F0280 Dementia in other diseases classified elsewhere without behavioral disturbance: Secondary | ICD-10-CM | POA: Diagnosis not present

## 2017-01-03 DIAGNOSIS — R1310 Dysphagia, unspecified: Secondary | ICD-10-CM | POA: Diagnosis not present

## 2017-01-03 DIAGNOSIS — R296 Repeated falls: Secondary | ICD-10-CM | POA: Diagnosis not present

## 2017-01-03 DIAGNOSIS — R634 Abnormal weight loss: Secondary | ICD-10-CM | POA: Diagnosis not present

## 2017-01-03 DIAGNOSIS — L89320 Pressure ulcer of left buttock, unstageable: Secondary | ICD-10-CM | POA: Diagnosis not present

## 2017-01-04 DIAGNOSIS — R634 Abnormal weight loss: Secondary | ICD-10-CM | POA: Diagnosis not present

## 2017-01-04 DIAGNOSIS — G3183 Dementia with Lewy bodies: Secondary | ICD-10-CM | POA: Diagnosis not present

## 2017-01-04 DIAGNOSIS — R1310 Dysphagia, unspecified: Secondary | ICD-10-CM | POA: Diagnosis not present

## 2017-01-04 DIAGNOSIS — R296 Repeated falls: Secondary | ICD-10-CM | POA: Diagnosis not present

## 2017-01-04 DIAGNOSIS — L89320 Pressure ulcer of left buttock, unstageable: Secondary | ICD-10-CM | POA: Diagnosis not present

## 2017-01-04 DIAGNOSIS — F0280 Dementia in other diseases classified elsewhere without behavioral disturbance: Secondary | ICD-10-CM | POA: Diagnosis not present

## 2017-01-06 DIAGNOSIS — R634 Abnormal weight loss: Secondary | ICD-10-CM | POA: Diagnosis not present

## 2017-01-06 DIAGNOSIS — R1310 Dysphagia, unspecified: Secondary | ICD-10-CM | POA: Diagnosis not present

## 2017-01-06 DIAGNOSIS — F0280 Dementia in other diseases classified elsewhere without behavioral disturbance: Secondary | ICD-10-CM | POA: Diagnosis not present

## 2017-01-06 DIAGNOSIS — R296 Repeated falls: Secondary | ICD-10-CM | POA: Diagnosis not present

## 2017-01-06 DIAGNOSIS — G3183 Dementia with Lewy bodies: Secondary | ICD-10-CM | POA: Diagnosis not present

## 2017-01-06 DIAGNOSIS — L89320 Pressure ulcer of left buttock, unstageable: Secondary | ICD-10-CM | POA: Diagnosis not present

## 2017-01-09 ENCOUNTER — Other Ambulatory Visit: Payer: Self-pay | Admitting: Internal Medicine

## 2017-01-10 DIAGNOSIS — R296 Repeated falls: Secondary | ICD-10-CM | POA: Diagnosis not present

## 2017-01-10 DIAGNOSIS — R1310 Dysphagia, unspecified: Secondary | ICD-10-CM | POA: Diagnosis not present

## 2017-01-10 DIAGNOSIS — L89320 Pressure ulcer of left buttock, unstageable: Secondary | ICD-10-CM | POA: Diagnosis not present

## 2017-01-10 DIAGNOSIS — F0280 Dementia in other diseases classified elsewhere without behavioral disturbance: Secondary | ICD-10-CM | POA: Diagnosis not present

## 2017-01-10 DIAGNOSIS — R634 Abnormal weight loss: Secondary | ICD-10-CM | POA: Diagnosis not present

## 2017-01-10 DIAGNOSIS — G3183 Dementia with Lewy bodies: Secondary | ICD-10-CM | POA: Diagnosis not present

## 2017-01-11 DIAGNOSIS — G3183 Dementia with Lewy bodies: Secondary | ICD-10-CM | POA: Diagnosis not present

## 2017-01-11 DIAGNOSIS — R634 Abnormal weight loss: Secondary | ICD-10-CM | POA: Diagnosis not present

## 2017-01-11 DIAGNOSIS — L89320 Pressure ulcer of left buttock, unstageable: Secondary | ICD-10-CM | POA: Diagnosis not present

## 2017-01-11 DIAGNOSIS — R1310 Dysphagia, unspecified: Secondary | ICD-10-CM | POA: Diagnosis not present

## 2017-01-11 DIAGNOSIS — R296 Repeated falls: Secondary | ICD-10-CM | POA: Diagnosis not present

## 2017-01-11 DIAGNOSIS — F0280 Dementia in other diseases classified elsewhere without behavioral disturbance: Secondary | ICD-10-CM | POA: Diagnosis not present

## 2017-01-13 DIAGNOSIS — R296 Repeated falls: Secondary | ICD-10-CM | POA: Diagnosis not present

## 2017-01-13 DIAGNOSIS — L89320 Pressure ulcer of left buttock, unstageable: Secondary | ICD-10-CM | POA: Diagnosis not present

## 2017-01-13 DIAGNOSIS — R634 Abnormal weight loss: Secondary | ICD-10-CM | POA: Diagnosis not present

## 2017-01-13 DIAGNOSIS — R1310 Dysphagia, unspecified: Secondary | ICD-10-CM | POA: Diagnosis not present

## 2017-01-13 DIAGNOSIS — G3183 Dementia with Lewy bodies: Secondary | ICD-10-CM | POA: Diagnosis not present

## 2017-01-13 DIAGNOSIS — R269 Unspecified abnormalities of gait and mobility: Secondary | ICD-10-CM | POA: Diagnosis not present

## 2017-01-13 DIAGNOSIS — I10 Essential (primary) hypertension: Secondary | ICD-10-CM | POA: Diagnosis not present

## 2017-01-13 DIAGNOSIS — F0280 Dementia in other diseases classified elsewhere without behavioral disturbance: Secondary | ICD-10-CM | POA: Diagnosis not present

## 2017-01-17 DIAGNOSIS — R1310 Dysphagia, unspecified: Secondary | ICD-10-CM | POA: Diagnosis not present

## 2017-01-17 DIAGNOSIS — F0280 Dementia in other diseases classified elsewhere without behavioral disturbance: Secondary | ICD-10-CM | POA: Diagnosis not present

## 2017-01-17 DIAGNOSIS — R296 Repeated falls: Secondary | ICD-10-CM | POA: Diagnosis not present

## 2017-01-17 DIAGNOSIS — G3183 Dementia with Lewy bodies: Secondary | ICD-10-CM | POA: Diagnosis not present

## 2017-01-17 DIAGNOSIS — L89320 Pressure ulcer of left buttock, unstageable: Secondary | ICD-10-CM | POA: Diagnosis not present

## 2017-01-17 DIAGNOSIS — R634 Abnormal weight loss: Secondary | ICD-10-CM | POA: Diagnosis not present

## 2017-01-20 DIAGNOSIS — F0280 Dementia in other diseases classified elsewhere without behavioral disturbance: Secondary | ICD-10-CM | POA: Diagnosis not present

## 2017-01-20 DIAGNOSIS — R634 Abnormal weight loss: Secondary | ICD-10-CM | POA: Diagnosis not present

## 2017-01-20 DIAGNOSIS — R1310 Dysphagia, unspecified: Secondary | ICD-10-CM | POA: Diagnosis not present

## 2017-01-20 DIAGNOSIS — G3183 Dementia with Lewy bodies: Secondary | ICD-10-CM | POA: Diagnosis not present

## 2017-01-20 DIAGNOSIS — R296 Repeated falls: Secondary | ICD-10-CM | POA: Diagnosis not present

## 2017-01-20 DIAGNOSIS — L89320 Pressure ulcer of left buttock, unstageable: Secondary | ICD-10-CM | POA: Diagnosis not present

## 2017-01-23 DIAGNOSIS — F0280 Dementia in other diseases classified elsewhere without behavioral disturbance: Secondary | ICD-10-CM | POA: Diagnosis not present

## 2017-01-23 DIAGNOSIS — L89320 Pressure ulcer of left buttock, unstageable: Secondary | ICD-10-CM | POA: Diagnosis not present

## 2017-01-23 DIAGNOSIS — R634 Abnormal weight loss: Secondary | ICD-10-CM | POA: Diagnosis not present

## 2017-01-23 DIAGNOSIS — G3183 Dementia with Lewy bodies: Secondary | ICD-10-CM | POA: Diagnosis not present

## 2017-01-23 DIAGNOSIS — R296 Repeated falls: Secondary | ICD-10-CM | POA: Diagnosis not present

## 2017-01-23 DIAGNOSIS — R1310 Dysphagia, unspecified: Secondary | ICD-10-CM | POA: Diagnosis not present

## 2017-01-24 DIAGNOSIS — R1310 Dysphagia, unspecified: Secondary | ICD-10-CM | POA: Diagnosis not present

## 2017-01-24 DIAGNOSIS — L89320 Pressure ulcer of left buttock, unstageable: Secondary | ICD-10-CM | POA: Diagnosis not present

## 2017-01-24 DIAGNOSIS — R634 Abnormal weight loss: Secondary | ICD-10-CM | POA: Diagnosis not present

## 2017-01-24 DIAGNOSIS — G3183 Dementia with Lewy bodies: Secondary | ICD-10-CM | POA: Diagnosis not present

## 2017-01-24 DIAGNOSIS — R296 Repeated falls: Secondary | ICD-10-CM | POA: Diagnosis not present

## 2017-01-24 DIAGNOSIS — F0280 Dementia in other diseases classified elsewhere without behavioral disturbance: Secondary | ICD-10-CM | POA: Diagnosis not present

## 2017-01-25 DIAGNOSIS — R296 Repeated falls: Secondary | ICD-10-CM | POA: Diagnosis not present

## 2017-01-25 DIAGNOSIS — L89320 Pressure ulcer of left buttock, unstageable: Secondary | ICD-10-CM | POA: Diagnosis not present

## 2017-01-25 DIAGNOSIS — R634 Abnormal weight loss: Secondary | ICD-10-CM | POA: Diagnosis not present

## 2017-01-25 DIAGNOSIS — G3183 Dementia with Lewy bodies: Secondary | ICD-10-CM | POA: Diagnosis not present

## 2017-01-25 DIAGNOSIS — R1310 Dysphagia, unspecified: Secondary | ICD-10-CM | POA: Diagnosis not present

## 2017-01-25 DIAGNOSIS — F0280 Dementia in other diseases classified elsewhere without behavioral disturbance: Secondary | ICD-10-CM | POA: Diagnosis not present

## 2017-01-27 DIAGNOSIS — R1310 Dysphagia, unspecified: Secondary | ICD-10-CM | POA: Diagnosis not present

## 2017-01-27 DIAGNOSIS — G3183 Dementia with Lewy bodies: Secondary | ICD-10-CM | POA: Diagnosis not present

## 2017-01-27 DIAGNOSIS — F0280 Dementia in other diseases classified elsewhere without behavioral disturbance: Secondary | ICD-10-CM | POA: Diagnosis not present

## 2017-01-27 DIAGNOSIS — R634 Abnormal weight loss: Secondary | ICD-10-CM | POA: Diagnosis not present

## 2017-01-27 DIAGNOSIS — L89320 Pressure ulcer of left buttock, unstageable: Secondary | ICD-10-CM | POA: Diagnosis not present

## 2017-01-27 DIAGNOSIS — R296 Repeated falls: Secondary | ICD-10-CM | POA: Diagnosis not present

## 2017-02-01 DIAGNOSIS — R1310 Dysphagia, unspecified: Secondary | ICD-10-CM | POA: Diagnosis not present

## 2017-02-01 DIAGNOSIS — F0280 Dementia in other diseases classified elsewhere without behavioral disturbance: Secondary | ICD-10-CM | POA: Diagnosis not present

## 2017-02-01 DIAGNOSIS — G3183 Dementia with Lewy bodies: Secondary | ICD-10-CM | POA: Diagnosis not present

## 2017-02-01 DIAGNOSIS — L89320 Pressure ulcer of left buttock, unstageable: Secondary | ICD-10-CM | POA: Diagnosis not present

## 2017-02-01 DIAGNOSIS — R296 Repeated falls: Secondary | ICD-10-CM | POA: Diagnosis not present

## 2017-02-01 DIAGNOSIS — R634 Abnormal weight loss: Secondary | ICD-10-CM | POA: Diagnosis not present

## 2017-02-03 DIAGNOSIS — F0280 Dementia in other diseases classified elsewhere without behavioral disturbance: Secondary | ICD-10-CM | POA: Diagnosis not present

## 2017-02-03 DIAGNOSIS — G3183 Dementia with Lewy bodies: Secondary | ICD-10-CM | POA: Diagnosis not present

## 2017-02-03 DIAGNOSIS — R634 Abnormal weight loss: Secondary | ICD-10-CM | POA: Diagnosis not present

## 2017-02-03 DIAGNOSIS — R296 Repeated falls: Secondary | ICD-10-CM | POA: Diagnosis not present

## 2017-02-03 DIAGNOSIS — L89320 Pressure ulcer of left buttock, unstageable: Secondary | ICD-10-CM | POA: Diagnosis not present

## 2017-02-03 DIAGNOSIS — R1310 Dysphagia, unspecified: Secondary | ICD-10-CM | POA: Diagnosis not present

## 2017-02-07 DIAGNOSIS — R296 Repeated falls: Secondary | ICD-10-CM | POA: Diagnosis not present

## 2017-02-07 DIAGNOSIS — F0280 Dementia in other diseases classified elsewhere without behavioral disturbance: Secondary | ICD-10-CM | POA: Diagnosis not present

## 2017-02-07 DIAGNOSIS — R634 Abnormal weight loss: Secondary | ICD-10-CM | POA: Diagnosis not present

## 2017-02-07 DIAGNOSIS — L89320 Pressure ulcer of left buttock, unstageable: Secondary | ICD-10-CM | POA: Diagnosis not present

## 2017-02-07 DIAGNOSIS — G3183 Dementia with Lewy bodies: Secondary | ICD-10-CM | POA: Diagnosis not present

## 2017-02-07 DIAGNOSIS — R1310 Dysphagia, unspecified: Secondary | ICD-10-CM | POA: Diagnosis not present

## 2017-02-08 DIAGNOSIS — R634 Abnormal weight loss: Secondary | ICD-10-CM | POA: Diagnosis not present

## 2017-02-08 DIAGNOSIS — F0280 Dementia in other diseases classified elsewhere without behavioral disturbance: Secondary | ICD-10-CM | POA: Diagnosis not present

## 2017-02-08 DIAGNOSIS — G3183 Dementia with Lewy bodies: Secondary | ICD-10-CM | POA: Diagnosis not present

## 2017-02-08 DIAGNOSIS — L89320 Pressure ulcer of left buttock, unstageable: Secondary | ICD-10-CM | POA: Diagnosis not present

## 2017-02-08 DIAGNOSIS — R296 Repeated falls: Secondary | ICD-10-CM | POA: Diagnosis not present

## 2017-02-08 DIAGNOSIS — R1310 Dysphagia, unspecified: Secondary | ICD-10-CM | POA: Diagnosis not present

## 2017-02-10 DIAGNOSIS — L89320 Pressure ulcer of left buttock, unstageable: Secondary | ICD-10-CM | POA: Diagnosis not present

## 2017-02-10 DIAGNOSIS — R1310 Dysphagia, unspecified: Secondary | ICD-10-CM | POA: Diagnosis not present

## 2017-02-10 DIAGNOSIS — R634 Abnormal weight loss: Secondary | ICD-10-CM | POA: Diagnosis not present

## 2017-02-10 DIAGNOSIS — R296 Repeated falls: Secondary | ICD-10-CM | POA: Diagnosis not present

## 2017-02-10 DIAGNOSIS — F0280 Dementia in other diseases classified elsewhere without behavioral disturbance: Secondary | ICD-10-CM | POA: Diagnosis not present

## 2017-02-10 DIAGNOSIS — G3183 Dementia with Lewy bodies: Secondary | ICD-10-CM | POA: Diagnosis not present

## 2017-02-12 DIAGNOSIS — R296 Repeated falls: Secondary | ICD-10-CM | POA: Diagnosis not present

## 2017-02-12 DIAGNOSIS — R1310 Dysphagia, unspecified: Secondary | ICD-10-CM | POA: Diagnosis not present

## 2017-02-12 DIAGNOSIS — I10 Essential (primary) hypertension: Secondary | ICD-10-CM | POA: Diagnosis not present

## 2017-02-12 DIAGNOSIS — L89320 Pressure ulcer of left buttock, unstageable: Secondary | ICD-10-CM | POA: Diagnosis not present

## 2017-02-12 DIAGNOSIS — R269 Unspecified abnormalities of gait and mobility: Secondary | ICD-10-CM | POA: Diagnosis not present

## 2017-02-12 DIAGNOSIS — G3183 Dementia with Lewy bodies: Secondary | ICD-10-CM | POA: Diagnosis not present

## 2017-02-12 DIAGNOSIS — R634 Abnormal weight loss: Secondary | ICD-10-CM | POA: Diagnosis not present

## 2017-02-12 DIAGNOSIS — F0280 Dementia in other diseases classified elsewhere without behavioral disturbance: Secondary | ICD-10-CM | POA: Diagnosis not present

## 2017-02-14 DIAGNOSIS — R634 Abnormal weight loss: Secondary | ICD-10-CM | POA: Diagnosis not present

## 2017-02-14 DIAGNOSIS — R1310 Dysphagia, unspecified: Secondary | ICD-10-CM | POA: Diagnosis not present

## 2017-02-14 DIAGNOSIS — F0280 Dementia in other diseases classified elsewhere without behavioral disturbance: Secondary | ICD-10-CM | POA: Diagnosis not present

## 2017-02-14 DIAGNOSIS — R296 Repeated falls: Secondary | ICD-10-CM | POA: Diagnosis not present

## 2017-02-14 DIAGNOSIS — G3183 Dementia with Lewy bodies: Secondary | ICD-10-CM | POA: Diagnosis not present

## 2017-02-14 DIAGNOSIS — L89320 Pressure ulcer of left buttock, unstageable: Secondary | ICD-10-CM | POA: Diagnosis not present

## 2017-02-15 DIAGNOSIS — L89320 Pressure ulcer of left buttock, unstageable: Secondary | ICD-10-CM | POA: Diagnosis not present

## 2017-02-15 DIAGNOSIS — R1310 Dysphagia, unspecified: Secondary | ICD-10-CM | POA: Diagnosis not present

## 2017-02-15 DIAGNOSIS — G3183 Dementia with Lewy bodies: Secondary | ICD-10-CM | POA: Diagnosis not present

## 2017-02-15 DIAGNOSIS — F0280 Dementia in other diseases classified elsewhere without behavioral disturbance: Secondary | ICD-10-CM | POA: Diagnosis not present

## 2017-02-15 DIAGNOSIS — R296 Repeated falls: Secondary | ICD-10-CM | POA: Diagnosis not present

## 2017-02-15 DIAGNOSIS — R634 Abnormal weight loss: Secondary | ICD-10-CM | POA: Diagnosis not present

## 2017-02-17 DIAGNOSIS — L89320 Pressure ulcer of left buttock, unstageable: Secondary | ICD-10-CM | POA: Diagnosis not present

## 2017-02-17 DIAGNOSIS — G3183 Dementia with Lewy bodies: Secondary | ICD-10-CM | POA: Diagnosis not present

## 2017-02-17 DIAGNOSIS — R634 Abnormal weight loss: Secondary | ICD-10-CM | POA: Diagnosis not present

## 2017-02-17 DIAGNOSIS — R296 Repeated falls: Secondary | ICD-10-CM | POA: Diagnosis not present

## 2017-02-17 DIAGNOSIS — F0280 Dementia in other diseases classified elsewhere without behavioral disturbance: Secondary | ICD-10-CM | POA: Diagnosis not present

## 2017-02-17 DIAGNOSIS — R1310 Dysphagia, unspecified: Secondary | ICD-10-CM | POA: Diagnosis not present

## 2017-02-21 DIAGNOSIS — G3183 Dementia with Lewy bodies: Secondary | ICD-10-CM | POA: Diagnosis not present

## 2017-02-21 DIAGNOSIS — L89320 Pressure ulcer of left buttock, unstageable: Secondary | ICD-10-CM | POA: Diagnosis not present

## 2017-02-21 DIAGNOSIS — F0280 Dementia in other diseases classified elsewhere without behavioral disturbance: Secondary | ICD-10-CM | POA: Diagnosis not present

## 2017-02-21 DIAGNOSIS — R634 Abnormal weight loss: Secondary | ICD-10-CM | POA: Diagnosis not present

## 2017-02-21 DIAGNOSIS — R296 Repeated falls: Secondary | ICD-10-CM | POA: Diagnosis not present

## 2017-02-21 DIAGNOSIS — R1310 Dysphagia, unspecified: Secondary | ICD-10-CM | POA: Diagnosis not present

## 2017-02-22 DIAGNOSIS — R1310 Dysphagia, unspecified: Secondary | ICD-10-CM | POA: Diagnosis not present

## 2017-02-22 DIAGNOSIS — G3183 Dementia with Lewy bodies: Secondary | ICD-10-CM | POA: Diagnosis not present

## 2017-02-22 DIAGNOSIS — L89320 Pressure ulcer of left buttock, unstageable: Secondary | ICD-10-CM | POA: Diagnosis not present

## 2017-02-22 DIAGNOSIS — R296 Repeated falls: Secondary | ICD-10-CM | POA: Diagnosis not present

## 2017-02-22 DIAGNOSIS — R634 Abnormal weight loss: Secondary | ICD-10-CM | POA: Diagnosis not present

## 2017-02-22 DIAGNOSIS — F0280 Dementia in other diseases classified elsewhere without behavioral disturbance: Secondary | ICD-10-CM | POA: Diagnosis not present

## 2017-02-24 DIAGNOSIS — R634 Abnormal weight loss: Secondary | ICD-10-CM | POA: Diagnosis not present

## 2017-02-24 DIAGNOSIS — R296 Repeated falls: Secondary | ICD-10-CM | POA: Diagnosis not present

## 2017-02-24 DIAGNOSIS — L89320 Pressure ulcer of left buttock, unstageable: Secondary | ICD-10-CM | POA: Diagnosis not present

## 2017-02-24 DIAGNOSIS — R1310 Dysphagia, unspecified: Secondary | ICD-10-CM | POA: Diagnosis not present

## 2017-02-24 DIAGNOSIS — F0280 Dementia in other diseases classified elsewhere without behavioral disturbance: Secondary | ICD-10-CM | POA: Diagnosis not present

## 2017-02-24 DIAGNOSIS — G3183 Dementia with Lewy bodies: Secondary | ICD-10-CM | POA: Diagnosis not present

## 2017-02-26 DIAGNOSIS — F0280 Dementia in other diseases classified elsewhere without behavioral disturbance: Secondary | ICD-10-CM | POA: Diagnosis not present

## 2017-02-26 DIAGNOSIS — R296 Repeated falls: Secondary | ICD-10-CM | POA: Diagnosis not present

## 2017-02-26 DIAGNOSIS — R634 Abnormal weight loss: Secondary | ICD-10-CM | POA: Diagnosis not present

## 2017-02-26 DIAGNOSIS — G3183 Dementia with Lewy bodies: Secondary | ICD-10-CM | POA: Diagnosis not present

## 2017-02-26 DIAGNOSIS — L89320 Pressure ulcer of left buttock, unstageable: Secondary | ICD-10-CM | POA: Diagnosis not present

## 2017-02-26 DIAGNOSIS — R1310 Dysphagia, unspecified: Secondary | ICD-10-CM | POA: Diagnosis not present

## 2017-02-27 DIAGNOSIS — R296 Repeated falls: Secondary | ICD-10-CM | POA: Diagnosis not present

## 2017-02-27 DIAGNOSIS — F0280 Dementia in other diseases classified elsewhere without behavioral disturbance: Secondary | ICD-10-CM | POA: Diagnosis not present

## 2017-02-27 DIAGNOSIS — R1310 Dysphagia, unspecified: Secondary | ICD-10-CM | POA: Diagnosis not present

## 2017-02-27 DIAGNOSIS — L89320 Pressure ulcer of left buttock, unstageable: Secondary | ICD-10-CM | POA: Diagnosis not present

## 2017-02-27 DIAGNOSIS — G3183 Dementia with Lewy bodies: Secondary | ICD-10-CM | POA: Diagnosis not present

## 2017-02-27 DIAGNOSIS — R634 Abnormal weight loss: Secondary | ICD-10-CM | POA: Diagnosis not present

## 2017-02-28 DIAGNOSIS — G3183 Dementia with Lewy bodies: Secondary | ICD-10-CM | POA: Diagnosis not present

## 2017-02-28 DIAGNOSIS — R634 Abnormal weight loss: Secondary | ICD-10-CM | POA: Diagnosis not present

## 2017-02-28 DIAGNOSIS — F0280 Dementia in other diseases classified elsewhere without behavioral disturbance: Secondary | ICD-10-CM | POA: Diagnosis not present

## 2017-02-28 DIAGNOSIS — R296 Repeated falls: Secondary | ICD-10-CM | POA: Diagnosis not present

## 2017-02-28 DIAGNOSIS — R1310 Dysphagia, unspecified: Secondary | ICD-10-CM | POA: Diagnosis not present

## 2017-02-28 DIAGNOSIS — L89320 Pressure ulcer of left buttock, unstageable: Secondary | ICD-10-CM | POA: Diagnosis not present

## 2017-03-01 DIAGNOSIS — G3183 Dementia with Lewy bodies: Secondary | ICD-10-CM | POA: Diagnosis not present

## 2017-03-01 DIAGNOSIS — R296 Repeated falls: Secondary | ICD-10-CM | POA: Diagnosis not present

## 2017-03-01 DIAGNOSIS — R1310 Dysphagia, unspecified: Secondary | ICD-10-CM | POA: Diagnosis not present

## 2017-03-01 DIAGNOSIS — F0280 Dementia in other diseases classified elsewhere without behavioral disturbance: Secondary | ICD-10-CM | POA: Diagnosis not present

## 2017-03-01 DIAGNOSIS — R634 Abnormal weight loss: Secondary | ICD-10-CM | POA: Diagnosis not present

## 2017-03-01 DIAGNOSIS — L89320 Pressure ulcer of left buttock, unstageable: Secondary | ICD-10-CM | POA: Diagnosis not present

## 2017-03-02 DIAGNOSIS — R1310 Dysphagia, unspecified: Secondary | ICD-10-CM | POA: Diagnosis not present

## 2017-03-02 DIAGNOSIS — F0280 Dementia in other diseases classified elsewhere without behavioral disturbance: Secondary | ICD-10-CM | POA: Diagnosis not present

## 2017-03-02 DIAGNOSIS — L89320 Pressure ulcer of left buttock, unstageable: Secondary | ICD-10-CM | POA: Diagnosis not present

## 2017-03-02 DIAGNOSIS — R296 Repeated falls: Secondary | ICD-10-CM | POA: Diagnosis not present

## 2017-03-02 DIAGNOSIS — G3183 Dementia with Lewy bodies: Secondary | ICD-10-CM | POA: Diagnosis not present

## 2017-03-02 DIAGNOSIS — R634 Abnormal weight loss: Secondary | ICD-10-CM | POA: Diagnosis not present

## 2017-03-03 DIAGNOSIS — L89320 Pressure ulcer of left buttock, unstageable: Secondary | ICD-10-CM | POA: Diagnosis not present

## 2017-03-03 DIAGNOSIS — G3183 Dementia with Lewy bodies: Secondary | ICD-10-CM | POA: Diagnosis not present

## 2017-03-03 DIAGNOSIS — R634 Abnormal weight loss: Secondary | ICD-10-CM | POA: Diagnosis not present

## 2017-03-03 DIAGNOSIS — F0280 Dementia in other diseases classified elsewhere without behavioral disturbance: Secondary | ICD-10-CM | POA: Diagnosis not present

## 2017-03-03 DIAGNOSIS — R1310 Dysphagia, unspecified: Secondary | ICD-10-CM | POA: Diagnosis not present

## 2017-03-03 DIAGNOSIS — R296 Repeated falls: Secondary | ICD-10-CM | POA: Diagnosis not present

## 2017-03-04 DIAGNOSIS — R1310 Dysphagia, unspecified: Secondary | ICD-10-CM | POA: Diagnosis not present

## 2017-03-04 DIAGNOSIS — R634 Abnormal weight loss: Secondary | ICD-10-CM | POA: Diagnosis not present

## 2017-03-04 DIAGNOSIS — G3183 Dementia with Lewy bodies: Secondary | ICD-10-CM | POA: Diagnosis not present

## 2017-03-04 DIAGNOSIS — L89320 Pressure ulcer of left buttock, unstageable: Secondary | ICD-10-CM | POA: Diagnosis not present

## 2017-03-04 DIAGNOSIS — F0280 Dementia in other diseases classified elsewhere without behavioral disturbance: Secondary | ICD-10-CM | POA: Diagnosis not present

## 2017-03-04 DIAGNOSIS — R296 Repeated falls: Secondary | ICD-10-CM | POA: Diagnosis not present

## 2017-03-05 DIAGNOSIS — R1310 Dysphagia, unspecified: Secondary | ICD-10-CM | POA: Diagnosis not present

## 2017-03-05 DIAGNOSIS — G3183 Dementia with Lewy bodies: Secondary | ICD-10-CM | POA: Diagnosis not present

## 2017-03-05 DIAGNOSIS — F0280 Dementia in other diseases classified elsewhere without behavioral disturbance: Secondary | ICD-10-CM | POA: Diagnosis not present

## 2017-03-05 DIAGNOSIS — R296 Repeated falls: Secondary | ICD-10-CM | POA: Diagnosis not present

## 2017-03-05 DIAGNOSIS — R634 Abnormal weight loss: Secondary | ICD-10-CM | POA: Diagnosis not present

## 2017-03-05 DIAGNOSIS — L89320 Pressure ulcer of left buttock, unstageable: Secondary | ICD-10-CM | POA: Diagnosis not present

## 2017-03-11 IMAGING — CT CT HEAD W/O CM
5 of 8 series · 16 of 47 positions shown, 17 images · non-contrast
Comparison: Prior CT from 08/20/2010.

CLINICAL DATA: Initial evaluation for acute trauma, unwitnessed
fall.

EXAM:
CT HEAD WITHOUT CONTRAST
CT CERVICAL SPINE WITHOUT CONTRAST
TECHNIQUE: Multidetector CT imaging of the head and cervical spine was
performed following the standard protocol without intravenous
contrast. Multiplanar CT image reconstructions of the cervical spine
were also generated.

[Series 3: head w/o · axial · non-contrast · 0.43mm/px · z∈[-196,-146]mm · 2 of 32 slices shown, 3 images]
[im 11/32  brain]
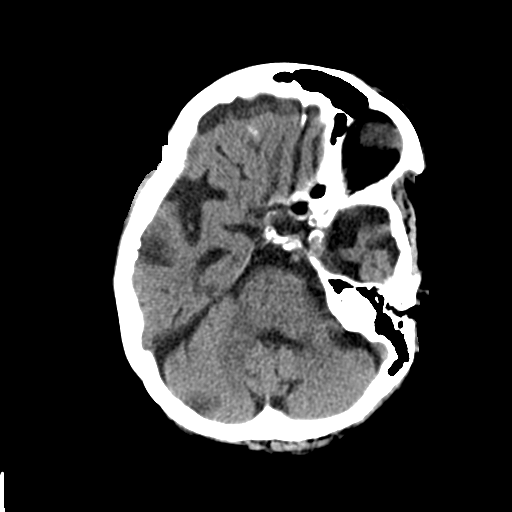
[im 11/32  bone]
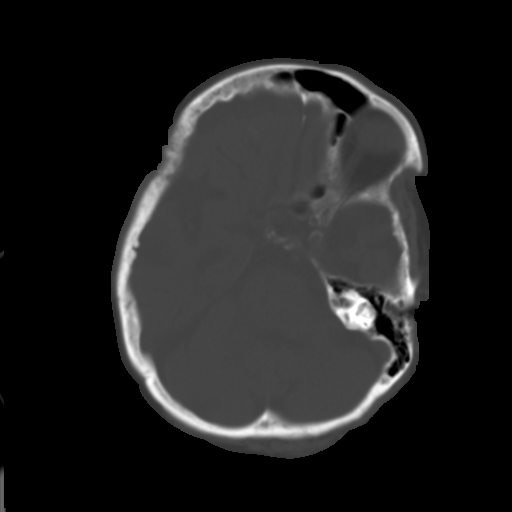
[im 21/32  brain]
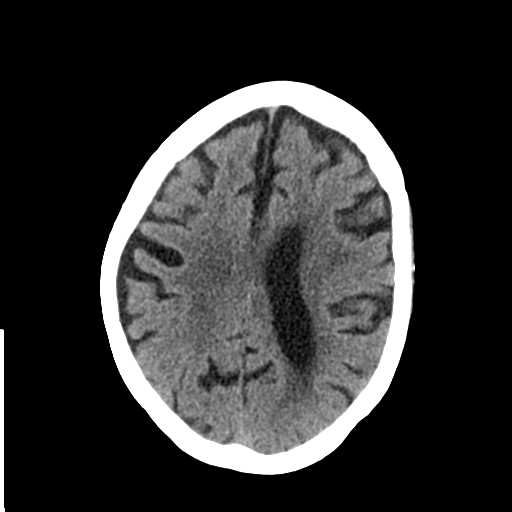

[Series 4: bone windows · axial · 0.43mm/px · 1 of 53 slices shown]
[im 9/53  bone]
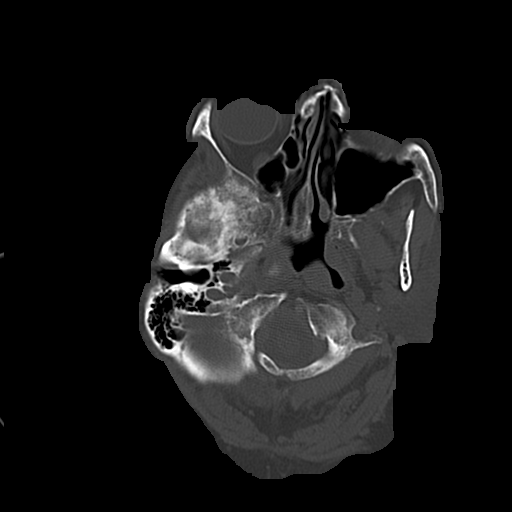

[Series 5: coronal · coronal · 0.30mm/px · 3 of 61 slices shown]
[im 18/61  brain]
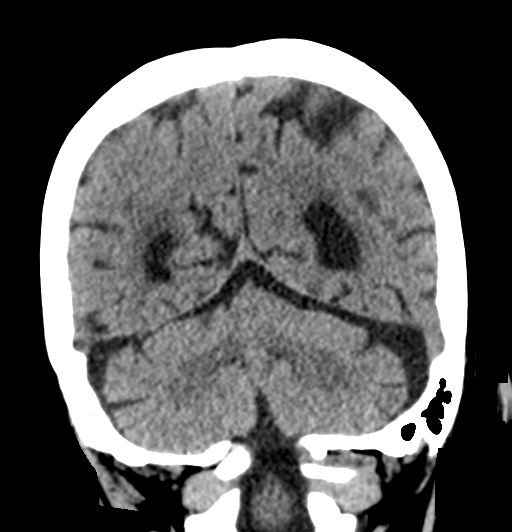
[im 26/61  brain]
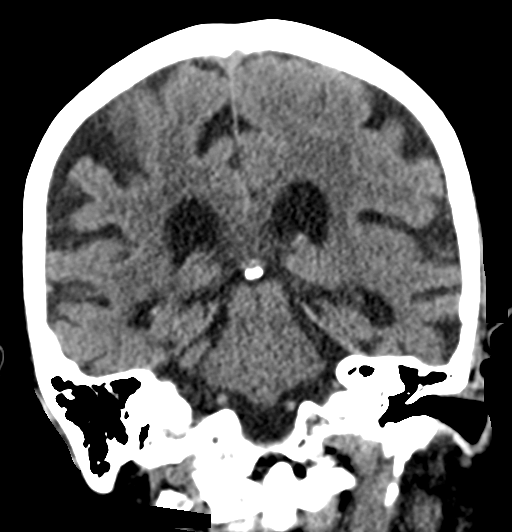
[im 35/61  brain]
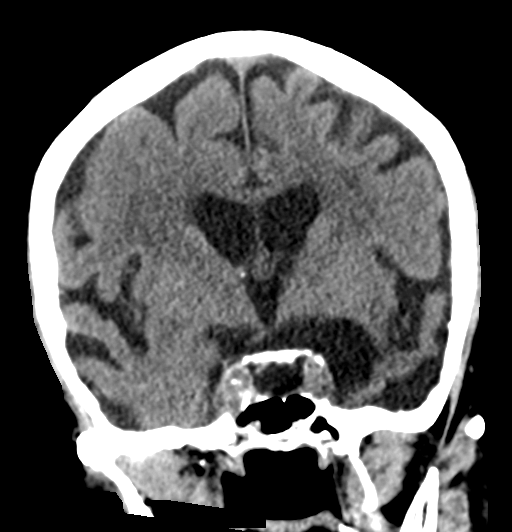

[Series 6: sagittal · sagittal · 0.31mm/px · 2 of 45 slices shown]
[im 15/45  brain]
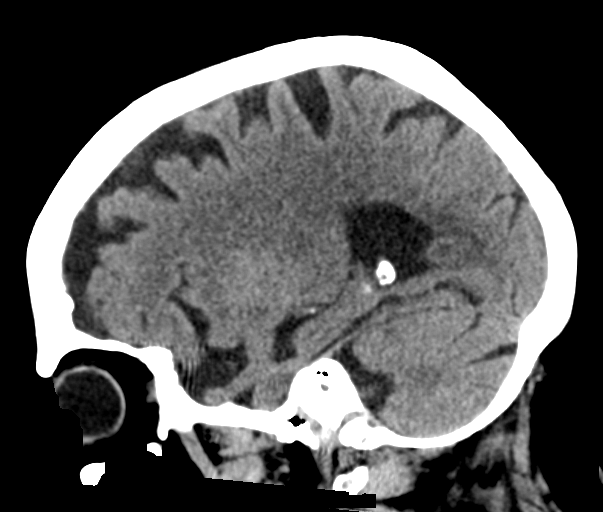
[im 30/45  brain]
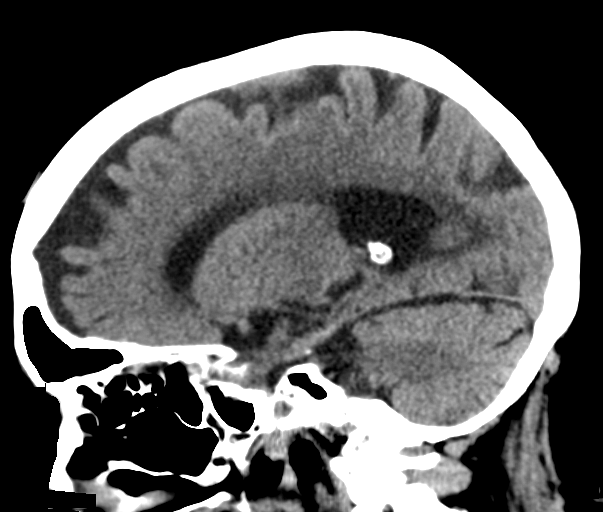

[Series 10: axial recon · axial · 0.17mm/px · z∈[-354,-258]mm · 8 of 74 slices shown]
[im 9/74  brain]
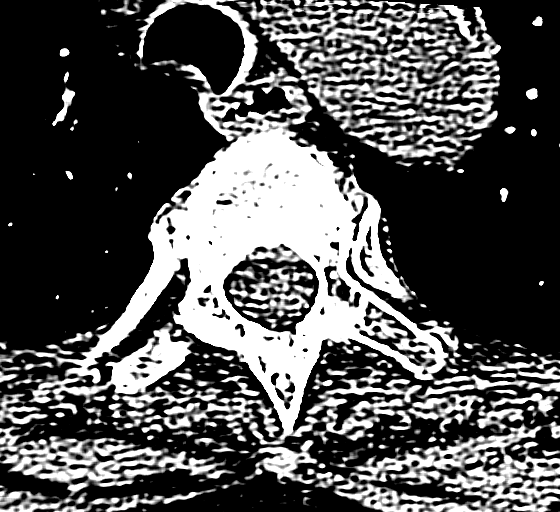
[im 17/74  brain]
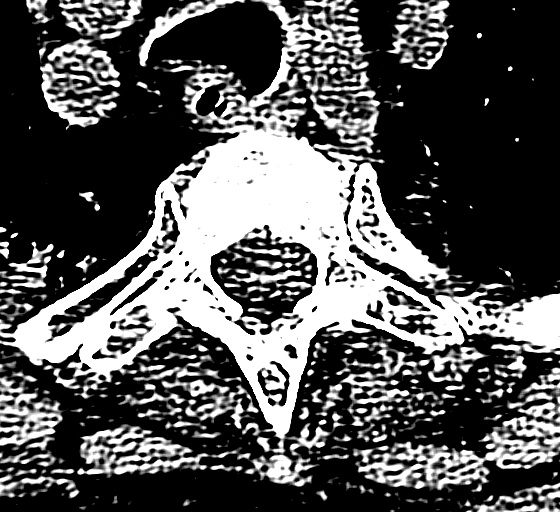
[im 25/74  brain]
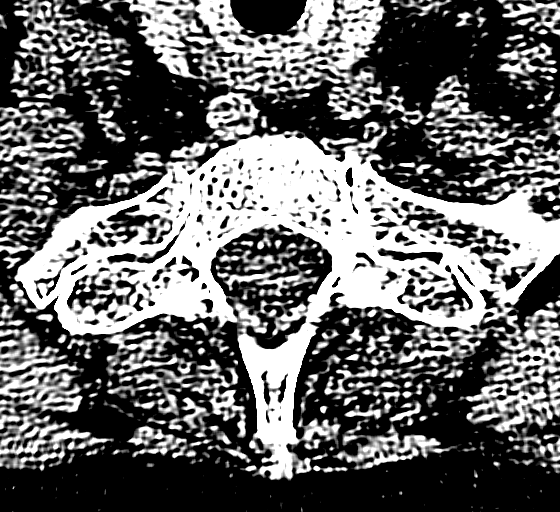
[im 33/74  brain]
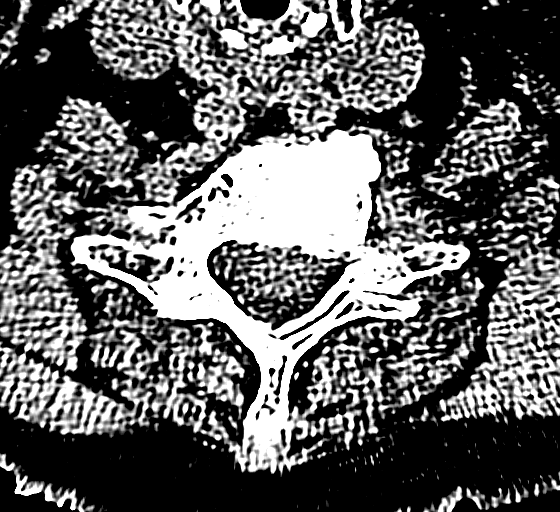
[im 41/74  brain]
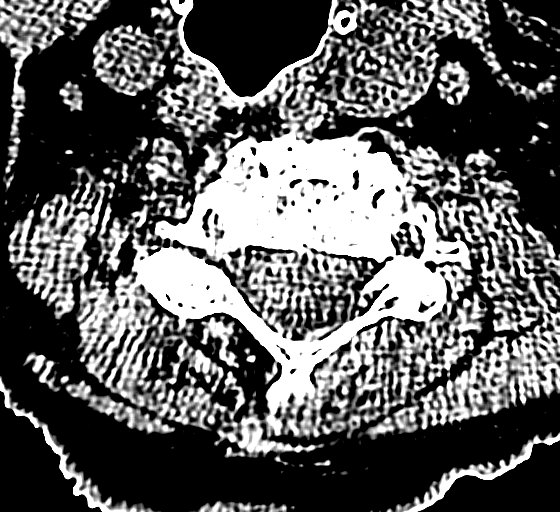
[im 49/74  brain]
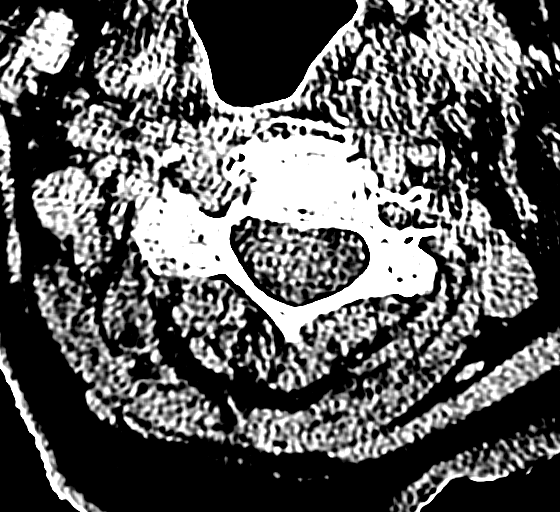
[im 57/74  brain]
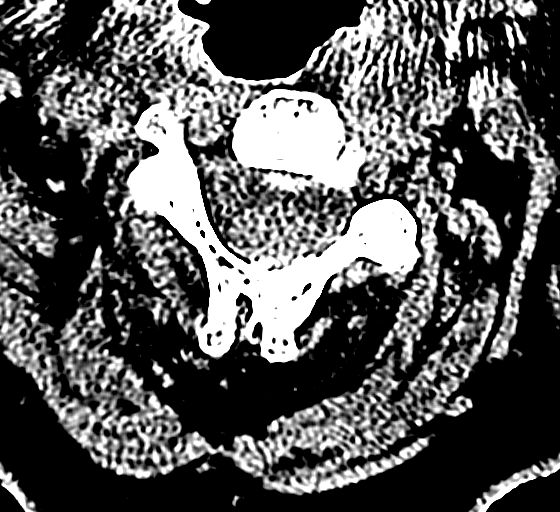
[im 65/74  brain]
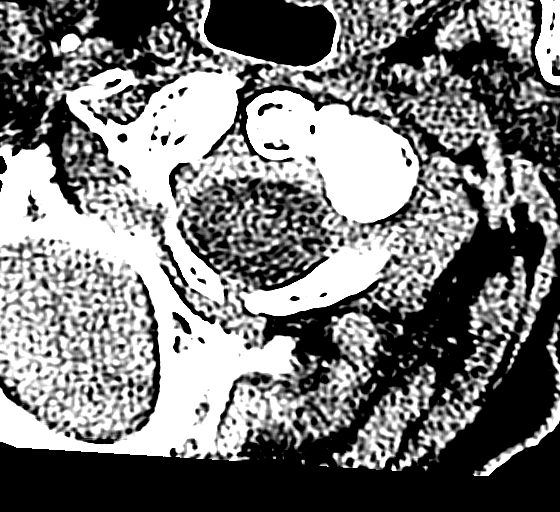

[16 of 47 positions shown; findings below may reference images not displayed]

FINDINGS: CT HEAD FINDINGS

Scalp soft tissues demonstrate no acute abnormality. No acute
abnormality about the orbits.

Paranasal sinuses are clear.  No mastoid effusion.

Calvarium intact.

Scattered vascular calcifications within the carotid siphons.
Age-related cerebral atrophy with mild chronic small vessel ischemic
disease.

No acute intracranial hemorrhage or large vessel territory infarct.
No mass lesion, midline shift, or mass effect. No hydrocephalus. No
extra-axial fluid collection.

CT CERVICAL SPINE FINDINGS

Reversal of the normal cervical lordosis with apex at C5. Trace
anterolisthesis of C3 on C4 and C4 on C5. Vertebral body heights are
preserved. Normal C1-2 articulations are intact. No prevertebral
soft tissue swelling. No acute fracture or listhesis. Incomplete
fusion of the posterior ring of C1 noted.

Moderate to advanced degenerative spondylolysis at C4-5, C5-6, and
C6-7. Multilevel facet arthrosis within the upper cervical spine.

Visualized soft tissues of the neck are within normal limits.
Visualized lung apices are clear without evidence of apical
pneumothorax. 4 mm nodule within the right upper lobe (series 7,
image 65).
IMPRESSION: CT BRAIN:

1. No acute intracranial process.
2. Generalized cerebral atrophy with mild chronic microvascular
ischemic disease, stable.

CT CERVICAL SPINE:

1. No acute traumatic injury within cervical spine.
2. Reversal of the normal cervical lordosis with advanced
degenerative spondylolysis at C4-5, C5-6, and C6-7, stable.
3. 4 mm right upper lobe nodule, indeterminate. No follow-up needed
if patient is low-risk. Non-contrast chest CT can be considered in
12 months if patient is high-risk. This recommendation follows the
consensus statement: Guidelines for Management of Incidental
Pulmonary Nodules Detected on CT Images:From the [HOSPITAL]
3741; published online before print (10.1148/radiol.[id number]).

## 2017-03-15 DEATH — deceased
